# Patient Record
Sex: Female | Born: 1985 | Race: White | Hispanic: No | Marital: Single | Smoking: Former smoker
Health system: Southern US, Community
[De-identification: ages and names within clinical notes are randomized; demographics above are authoritative.]

## PROBLEM LIST (undated history)

## (undated) ENCOUNTER — Inpatient Hospital Stay (HOSPITAL_COMMUNITY): Payer: Self-pay

## (undated) DIAGNOSIS — F329 Major depressive disorder, single episode, unspecified: Secondary | ICD-10-CM

## (undated) DIAGNOSIS — R519 Headache, unspecified: Secondary | ICD-10-CM

## (undated) DIAGNOSIS — F32A Depression, unspecified: Secondary | ICD-10-CM

## (undated) DIAGNOSIS — T783XXA Angioneurotic edema, initial encounter: Secondary | ICD-10-CM

## (undated) DIAGNOSIS — L509 Urticaria, unspecified: Secondary | ICD-10-CM

## (undated) DIAGNOSIS — F909 Attention-deficit hyperactivity disorder, unspecified type: Secondary | ICD-10-CM

## (undated) DIAGNOSIS — Z8619 Personal history of other infectious and parasitic diseases: Secondary | ICD-10-CM

## (undated) DIAGNOSIS — R51 Headache: Secondary | ICD-10-CM

## (undated) DIAGNOSIS — F419 Anxiety disorder, unspecified: Secondary | ICD-10-CM

## (undated) DIAGNOSIS — J45909 Unspecified asthma, uncomplicated: Secondary | ICD-10-CM

## (undated) HISTORY — DX: Angioneurotic edema, initial encounter: T78.3XXA

## (undated) HISTORY — PX: NO PAST SURGERIES: SHX2092

## (undated) HISTORY — DX: Personal history of other infectious and parasitic diseases: Z86.19

## (undated) HISTORY — PX: TYMPANOSTOMY TUBE PLACEMENT: SHX32

## (undated) HISTORY — DX: Urticaria, unspecified: L50.9

## (undated) HISTORY — DX: Unspecified asthma, uncomplicated: J45.909

---
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
## 2006-01-06 ENCOUNTER — Ambulatory Visit (HOSPITAL_COMMUNITY): Payer: Self-pay | Admitting: Psychiatry

## 2006-01-20 ENCOUNTER — Ambulatory Visit (HOSPITAL_COMMUNITY): Payer: Self-pay | Admitting: Psychiatry

## 2006-02-10 ENCOUNTER — Ambulatory Visit (HOSPITAL_COMMUNITY): Payer: Self-pay | Admitting: Psychiatry

## 2006-04-28 ENCOUNTER — Ambulatory Visit (HOSPITAL_COMMUNITY): Payer: Self-pay | Admitting: Psychiatry

## 2009-01-04 ENCOUNTER — Ambulatory Visit: Payer: Self-pay | Admitting: Diagnostic Radiology

## 2009-01-04 ENCOUNTER — Emergency Department (HOSPITAL_BASED_OUTPATIENT_CLINIC_OR_DEPARTMENT_OTHER): Admission: EM | Admit: 2009-01-04 | Discharge: 2009-01-04 | Payer: Self-pay | Admitting: Emergency Medicine

## 2009-01-05 ENCOUNTER — Emergency Department (HOSPITAL_BASED_OUTPATIENT_CLINIC_OR_DEPARTMENT_OTHER): Admission: EM | Admit: 2009-01-05 | Discharge: 2009-01-05 | Payer: Self-pay | Admitting: Emergency Medicine

## 2010-07-25 LAB — COMPREHENSIVE METABOLIC PANEL
ALT: 16 U/L (ref 0–35)
AST: 24 U/L (ref 0–37)
Albumin: 4.6 g/dL (ref 3.5–5.2)
Alkaline Phosphatase: 80 U/L (ref 39–117)
CO2: 29 mEq/L (ref 19–32)
Creatinine, Ser: 0.8 mg/dL (ref 0.4–1.2)
GFR calc non Af Amer: >60 mL/min (ref >60)
Glucose, Bld: 86 mg/dL (ref 70–99)
Potassium: 3.9 mEq/L (ref 3.5–5.1)
Sodium: 142 mEq/L (ref 135–145)
Total Bilirubin: 0.4 mg/dL (ref 0.3–1.2)

## 2010-07-25 LAB — URINALYSIS, ROUTINE W REFLEX MICROSCOPIC
Bilirubin Urine: NEGATIVE
Glucose, UA: NEGATIVE mg/dL
Ketones, ur: NEGATIVE mg/dL
Specific Gravity, Urine: 1.003 — ABNORMAL LOW (ref 1.005–1.030)
pH: 7 (ref 5.0–8.0)

## 2010-07-25 LAB — WET PREP, GENITAL: Trich, Wet Prep: NONE SEEN

## 2010-07-25 LAB — URINE MICROSCOPIC-ADD ON

## 2010-07-25 LAB — GC/CHLAMYDIA PROBE AMP, GENITAL
Chlamydia, DNA Probe: NEGATIVE
GC Probe Amp, Genital: NEGATIVE

## 2010-07-25 LAB — CBC
HCT: 45.2 % (ref 36.0–46.0)
Hemoglobin: 15.7 g/dL — ABNORMAL HIGH (ref 12.0–15.0)
MCV: 95.9 fL (ref 78.0–100.0)
Platelets: 271 10*3/uL (ref 150–400)
RBC: 4.71 MIL/uL (ref 3.87–5.11)

## 2010-07-25 LAB — POCT CARDIAC MARKERS: CKMB, poc: <1 ng/mL — ABNORMAL LOW (ref 1.0–8.0)

## 2010-07-25 LAB — DIFFERENTIAL
Basophils Absolute: 0.1 10*3/uL (ref 0.0–0.1)
Basophils Relative: 1 % (ref 0–1)
Lymphocytes Relative: 23 % (ref 12–46)
Lymphs Abs: 3.8 10*3/uL (ref 0.7–4.0)
Monocytes Absolute: 1.1 10*3/uL — ABNORMAL HIGH (ref 0.1–1.0)
Monocytes Relative: 7 % (ref 3–12)

## 2010-09-05 NOTE — Discharge Summary (Signed)
[facility]  Patient:    [name], [name]                          MRN: [id number] Adm. Date:  [id number] Disc. Date: 08/27/00 Attending:  Veneta Penton                           Discharge Summary  REASON FOR ADMISSION:  This 25 year old white female was admitted complaining of depression with suicidal ideation with a plan to kill herself by obtaining a gun from a friend and shooting herself.  For further history of present illness, please see the patients psychiatric admission assessment.  PHYSICAL EXAMINATION:  At the time of admission, significant only for old healed self-inflicted lacerations on the left forearm.  LABORATORY EXAMINATION:  The patient underwent a laboratory workup to rule out any medical problems contributing to her symptomatology.  A urine drug screen was negative.  Urine probe for gonorrhea and chlamydia are pending at the time of discharge.  Urine pregnancy test was negative.  Thyroid function tests were within normal limits.  A UA was unremarkable.  CBC showed MCHC of 35.0 and was otherwise unremarkable.  A potassium level, on admission, was 3.4 and a routine chem panel was otherwise unremarkable.  Hepatic panel was within normal limits.  The patient received no x-rays, no special procedures, no additional consultation.  She sustained no complications during the course of her hospitalization.  RPR was nonreactive.  HOSPITAL COURSE:  The patient rapidly adapted to unit routine socializing well with both patients and staff.  She showed an excessive reliance of borderline and histrionic defense mechanisms.  She was continued on Concerta 54 mg.  Her Effexor XR was titrated up to a therapeutic range.  She, in relying on borderline defense mechanisms, showed occasional disorganization of thoughts whenever she became angry or agitated and had difficulty sleeping at night with this.  She was begun on a trial of Seroquel at 25 mg p.o.  q.h.s. and tolerated this well and reports this has greatly aided in decreasing her agitation and enabling her to sleep.  She is continuing to do well on Effexor XR.  She denies any suicidal or homicidal ideation and she denies any side effects from her medications.  She is participating in all aspects of the therapeutic treatment program, no longer appears to be a danger to herself or others, is able to perform all her activities of daily living and is consequently felt to have reached her maximum benefits of hospitalization and is ready for discharge to a less restrictive alternative setting.  CONDITION ON DISCHARGE:  Improved.  DIAGNOSES:  (According to DSM-IV). Axis I:    1. Major depression, recurrent-type, severe without psychosis.            2. Oppositional defiant disorder.            3. Attention-deficit hyperactivity disorder, combined-type.            4. Rule out conduct disorder. Axis II:   1. Histrionic and borderline traits.            2. Rule out personality disorder not otherwise specified. Axis III:  None. Axis IV:   Severe. Axis V:    10-20 on admission; 30 on discharge.  FURTHER EVALUATION AND TREATMENT RECOMMENDATIONS: 1. The patient is discharged to home. 2. She is discharged on an unrestricted level of activity and  a regular diet. 3. She is discharged on Effexor XR 225 mg p.o. q.a.m. with food, Seroquel    25 mg p.o. q.h.s. p.r.n. for agitation or insomnia, Concerta 54 mg p.o.    q.a.m. 4. She will follow up with her outpatient psychiatrist for all further aspects    of her psychiatric care and consequently I will sign off on the case at    this time. DD:  08/27/00 TD:  08/27/00 Job: [id number]

## 2010-09-05 NOTE — H&P (Signed)
NAMECHANTELL, [name]                             ACCOUNT NO.:  [id number]   MEDICAL RECORD NO.:  [id number]                   PATIENT TYPE:  INP   LOCATION:  0104                                 [facility]ILITY[facility]   PHYSICIAN:  Nawar M. Alnaquib, M.D.             DATE OF BIRTH:  [birth year]   DATE OF ADMISSION:  10/23/2002  DATE OF DISCHARGE:  10/27/2002                         PSYCHIATRIC ADMISSION ASSESSMENT   HISTORY OF PRESENT ILLNESS:  This 25 year old black female was admitted  because she was reported to be suicidal by the mother and grandmother, and  she denies that she was suicidal before.  There was a fight with mom who  told her that she did not want her anymore in house.  The patient started  crying and she reports that she and her mom were not getting along well for  the last 2 years.  Mom acts as if she hates me.  She yells at me.  The  patient is __________ , but this has been happening to __________  lately.   JUSTIFICATION FOR 24-HOUR CARE:  Dangerous to self.   PAST PSYCHIATRIC HISTORY:  Fourth psychiatric admission to [facility].   MENTAL STATUS EXAM:  On admission, the patient states she is upset about  everything going on in her life, however she was not suicidal nor homicidal.  No auditory or visual hallucinations, no delusions.  Anxious appearing,  about her placement, where she is going to go and __________ .   ADMISSION DIAGNOSES:   AXIS I:  Major depressive disorder with suicidal ideation.   AXIS II:  Deferred.   AXIS III:  None.   AXIS IV:  Parent/child relation problems.   AXIS V:  Global assessment of function:  35.  __________   ESTIMATED LENGTH OF INPATIENT STAY:  One week.   HOSPITAL COURSE:  The patient participated well in group and individual  therapy and family meetings, and was compliant with medications.  The  following were the lab results.  CBC:  White blood count was 13.1, RBC count  30.99, hemoglobin 13.7,  hematocrit 37.4.  MCV 93.7.  MCHC 35.3, platelets  268, neutrophils 66, lymphocytes 25.  There was an increased percentage of  neutrophils.  However, the rest of the differential count was normal.  Routine chemistries:  Sodium 139, potassium 3.9, chloride 107, CO2 57,  glucose 84, BUN 7, creatinine 0.7, calcium 9.4.  Lipid panel was also within  normal limits.  __________ profile was normal.  Thyroid function test was  normal.  __________ 64 .  Urine pregnancy test was negative.  Urine drug  screen __________ was negative.  Urinalysis was clear.  GC probe was  negative.  Chlamydia probe was negative.  Those were all the tests that were  done.  Negative RPR.   At the time of discharge, the __________ .    DISCHARGE DIAGNOSES:  AXIS I:  Major depressive disorder with suicidal ideation.   AXIS II:  Deferred.   AXIS III:  None.   AXIS IV:  Status post __________ , parental/child relation problems.   AXIS V:  Global assessment of function 50.   DISPOSITION:  She was discharged to home.  __________ .  __________ 40 mg  q.a.m.  Followup __________ with Dr. Ralene Muskrat at __________  [facility], and will also be receiving individual therapy.                                               Nawar M. Alnaquib, M.D.    NMA/MEDQ  D:  11/18/2002  T:  11/20/2002  Job:  [id number]

## 2010-09-05 NOTE — Discharge Summary (Signed)
[name], [name]                             ACCOUNT NO.:  [id number]   MEDICAL RECORD NO.:  [id number]                   PATIENT TYPE:  INP   LOCATION:  0199                                 [facility]ACILITY:  [facility]   PHYSICIAN:  Cindie Crumbly, M.D.               DATE OF BIRTH:  Feb 02, 1986   DATE OF ADMISSION:  06/12/2002  DATE OF DISCHARGE:                                 DISCHARGE SUMMARY   REASON FOR ADMISSION:  This 25 year old white female was admitted  complaining of depression with suicidal ideation with a plan to kill  herself. For further history of present illness please see the patient's  psychiatric admission assessment.   PHYSICAL EXAMINATION:  At the time of admission was entirely unremarkable.   LABORATORY DATA:  The patient underwent a laboratory workup to rule out any  medical problems contributing to her symptomology. A urine probe for  gonorrhea and Chlamydia were negative. RPR was nonreactive. TSH was 0.612,  free T4 was 0.88. GGT was within normal limits. Hepatic panel was within  normal limits. Basic metabolic panel was within normal limits. A CBC showed  an MCV of 93.5, MCHC of 34.5 and was otherwise unremarkable. Urine drug  screen was negative. Urine pregnancy test was negative. Urinalysis was  unremarkable.   The patient received no x-rays, no special procedures, no additional  consultations. She sustained no complications during the course of this  hospitalization.   HOSPITAL COURSE:  On admission the patient was  psychomotor agitated with  decreased concentration, poor impulse control, decreased attention span. She  was easily distracted by extraneous stimuli. She was oppositional and  defiant, gamy and manipulative. Her affect and mood were depressed,  irritable, angry and hostile. She was continued on Lexapro and Strattera and  tolerated these medications well without side effects.   At the time of discharge she denied any suicidal or homicidal  ideation. Her  affect and mood have improved. Her  concentration has increased. She no  longer appears to be a danger to herself or others. She has displayed no  evidence of a thought disorder throughout her hospital course. Consequently  it is felt she has reached her maximum benefits of hospitalization and is  ready for discharge to a less restrictive alternative setting.   CONDITION ON DISCHARGE:  Improved.   DIAGNOSES:  According to DSM4:   AXIS I:  1. Major depression, recurrent, severe without psychosis.  2. Attention deficit hyperactivity disorder, combined type.  3. Oppositional defiant disorder.  4. Rule out conduct disorder.   AXIS II:  1. Borderline and histrionic traits.  2. Rule out personality disorder not otherwise specified.  3. Rule out learning disorder not otherwise specified.   AXIS III:  None.   AXIS IV:  Severe.   AXIS V:  Code 20 on admission, code 30 on discharge.   FURTHER EVALUATION AND TREATMENT RECOMMENDATIONS:  The  patient is discharged  to home. She is discharged on an unrestricted level of activity and a  regular diet.   FOLLOW UP:  She will follow up with her outpatient psychiatrist, Dr. Kizzie Bane,  for all further aspects of her psychiatric care and consequently I will sign  off on the case.                                               Cindie Crumbly, M.D.    TS/MEDQ  D:  06/16/2002  T:  06/16/2002  Job:  [id number]

## 2010-09-05 NOTE — H&P (Signed)
[facility]  Patient:    [name], [name]                          MRN: [id number] Adm. Date:  [id number] Attending:  Milford Cage H                   Psychiatric Admission Assessment  REASON FOR ADMISSION:  This 25 year old white female was admitted complaining of depression with suicidal ideation with a plan to shoot herself with a gun. She had no access to a gun, but also stated that she would cut her wrists and bleed to death.  She was unable to contract for safety.  She also had been making superficial cuts on her left forearm as a preliminary of harming herself.  HISTORY OF PRESENT ILLNESS:  She admits to recurrent episodes of major depression and admits to a depressed, irritable, and angry mood most of the day nearly every day over the past several months, along with anhedonia, decreased school performance, decreased hygiene, giving up on activities previous enjoyed, insomnia, decreased appetite.  She has been increasingly isolative and withdrawn.  She admits to feelings of hopelessness, helplessness, worthlessness, decreased concentration and energy level, increased symptoms of fatigue, psychomotor agitation.  She refuses to contract for safety.  PAST HISTORY:  Significant for her being treated in outpatient therapy by Dr. Milford Cage.  She has a past history of attention deficit hyperactivity disorder, combined type, oppositional defiant disorder, and recurrent episodes of major depression.  She denies any history of drug or alcohol abuse.  She denies any medical or surgical problems.  ALLERGIES:  She is allergic to [facility], but otherwise has no drug allergies or sensitivities.  MEDICATIONS:  She currently is taking Effexor XR 75 mg p.o. q.a.m. for the past 1-2 months, which she reports has not been helpful to her.  She also reports taking Concerta 18 mg p.o. b.i.d. and also is continuing to have poor concentration in  school.  FAMILY/SOCIAL HISTORY:  The patients father died eight years ago in a motor vehicle accident that was alcohol related.  He had a history of alcoholism. The patient lives with her mother and 61 year old sister.  Her mother is very supportive of her, but the patient reports getting into a great deal of oppositional and defiant behavior with her mother.  MENTAL STATUS EXAMINATION:  The patient presents as a well-developed, well-nourished adolescent white female who is alert, oriented x 4, oppositional defiant, disheveled, unkept, with poor hygiene, easily distracted by extraneous stimuli.  She is psychomotor agitated with decreased concentration and attention span, poor impulse control.  Her affect and mood are depressed and irritable.  Speech is coherent to a decreased rate and volume of speech, increased speech latency.  She displays no looseness of associations or evidence of a thought disorder.  Her thought processes are generally goal directed.  DIAGNOSES: Axis I:    1. Major depression, recurrent, severe, without psychosis.            2. Oppositional defiant disorder.            3. Rule out conduct disorder.            4. Attention deficit hyperactivity disorder, combined type. Axis II:   1. Histrionic traits.            2. Rule out personality disorder, not otherwise specified. Axis III:  None. Axis IV:   Severe. Axis V:  Code 20.  FURTHER EVALUATION AND TREATMENT RECOMMENDATIONS: 1. The estimated length of stay for the patient on the inpatient unit is 5-7    days. 2. Initial discharge plan is to discharge the patient to home.  INITIAL PLAN OF CARE:  Increase the patients Effexor XR to 150 mg p.o. q.a.m. and consider increasing Concerta in an attempt to improve concentration and attention span.  Psychotherapy will focus on decreasing potential for harm to self and others, increasing impulse control, and decreasing cognitive distortions.  A laboratory workup will  also be initiated to rule out any medical problems contributing to her symptomatology. DD:  07/02/00 TD:  07/02/00 Job: [id number]

## 2010-09-05 NOTE — Discharge Summary (Signed)
[name], [name]                             ACCOUNT NO.:  [id number]   MEDICAL RECORD NO.:  [id number]                   PATIENT TYPE:  INP   LOCATION:  0104                                 [facility]ILITY[facility]   PHYSICIAN:  Nawar M. Alnaquib, M.D.             DATE OF BIRTH:  03-19-1986   DATE OF ADMISSION:  10/23/2002  DATE OF DISCHARGE:  10/27/2002                                 DISCHARGE SUMMARY   HISTORY OF PRESENT ILLNESS:  This is a 25 year old African-American female  admitted because he was reported to be suicidal by her mother and her  grandmother and there was a fight between the mother and the patient and the  mother had told her that she did not want her in the house anymore.   JUSTIFICATION FOR 24-HOUR CARE:  Dangerous to self.   PAST PSYCHIATRIC HISTORY:  Fourth psychiatric admission to [facility].   MENTAL STATUS EXAM:  On admission, very upset about everything going on in  her life, she stated.  She was, however, not suicidal or homicidal.  She  denied completely any suicidality.  She denied auditory or visual  hallucinations.  She denied delusions.  Appeared very anxious about her  placement and where she is going to go.   ADMISSION DIAGNOSES:   AXIS I:  Major depressive disorder with suicidal ideation.   AXIS II:  Deferred.   AXIS III:  None.   AXIS IV:  Parent-child relation problems.   AXIS V:  Global Assessment of Functioning 35.   HOSPITAL COURSE:  The patient participated well in group, individual therapy  and family meetings and was compliant with medications.  The hospital course  was uneventful.   LABORATORY DATA:  CBC with white blood cell count 13.1, RBC count 3.9,  hemoglobin 13.7, hematocrit 37.4, MCV 93.7, MCHC 35.3, platelets 268,000.  Neutrophils 66%, lymphocytes 25%, increased percentage of neutrophils was  reported.  Routine chemistries were normal.  Serum sodium was 139, potassium  3.9, chloride 107, CO2 57, glucose  84, BUN 7, creatinine 0.75, calcium 9.4.  Lipid profile was also within normal limits.  Thyroid function tests were  normal.  Urine pregnancy test was negative.  Urine drug screen was negative.  Urinalysis was clear.  Probe for GC was negative.  Chlamydia probe was also  negative.  RPR was nonreactive.   DISCHARGE DIAGNOSES:   AXIS I:  Major depressive disorder with suicidal ideation, in remission.   AXIS II:  Deferred.   AXIS III:  None.   AXIS IV:  Parent-child relational problems.   AXIS V:  Global Assessment of Functioning 50.   DISPOSITION:  She was discharged home.   FOLLOW UP:  Dr. Ralene Muskrat at [facility].  She will also be  receiving individual therapy at that clinic.  Nawar M. Alnaquib, M.D.    NMA/MEDQ  D:  12/09/2002  T:  12/10/2002  Job:  [id number]

## 2010-09-05 NOTE — H&P (Signed)
[facility]  Patient:    [name], [name]                          MRN: [id number] Adm. Date:  [id number] Attending:  Veneta Penton                   Psychiatric Admission Assessment  DATE OF ADMISSION:  Aug 23, 2000  PATIENT IDENTIFICATION:  This 25 year old white female was admitted complaining of depression with suicidal ideation with a plan to kill herself by obtaining a gun from a friend and shooting herself.  HISTORY OF PRESENT ILLNESS:  The patient apparently had been emailing friends trying to obtain a gun so that she could use it to suicide.  She admits to an increasingly depressed, irritable, and angry mood most of the day nearly every day, anhedonia, decreased school performance, giving up on activities previously enjoyed, decreased concentration and energy level, insomnia, decreased appetite, feelings of hopelessness, helplessness, worthlessness, increased symptoms of fatigue, recurrent thoughts of death, psychomotor agitation.  PAST PSYCHIATRIC HISTORY:  She was last hospitalized at [facility] in March 2002 for a period of five days.  At that time, she was discharged on Effexor XR 150 mg p.o. q.d. and Concerta 54 mg p.o. q.a.m.  She did well on this medication while hospitalized but apparently was noncompliant with taking after leaving the hospital.  She has been seen in outpatient psychiatric treatment by Dr. Milford Cage.  She has a history of attention-deficit/hyperactivity disorder, oppositional defiant disorder, and recurrent episodes of major depression as well as a longstanding history of histrionic traits and is heavily reliant on histrionic and borderline defense mechanisms.  SUBSTANCE ABUSE HISTORY:  The patient reports trying alcohol and cannabis on one occasion in the distant past.  She denies any recent use.  PAST MEDICAL HISTORY:  She has no history of medical or surgical problems. She is  allergic to [facility].  SOCIAL HISTORY:  The patient lives with her mother and 24 year old sister. She is in the seventh grade.  Father died approximately eight years ago in a motor vehicle accident while he was intoxicated.  He had a history of alcohol problems.  MENTAL STATUS EXAMINATION:  The patient presents as a well-developed, well-nourished, adolescent female who is alert and oriented x 4, oppositional and defiant, psychomotor agitated with a furrowed brow, and whose appearance is compatible with her stated age.  Speech is coherent with a decreased rate and volume of speech, increased speech latency.  She displays no looseness of associations or evidence of a thought disorder.  Affect and mood are depressed, irritable, and angry.  Concentration and attention span are decreased.  She is easily distracted, displays poor impulse control. Immediate recall, short-term memory, and remote memory are intact.  Thought processes are goal directed.  Similarities and differences are within normal limits and she is able to abstract simple proverbs.  ADMISSION DIAGNOSES: Axis I:    1. Major depression, recurrent, severe without psychosis.            2. Oppositional defiant disorder.            3. Rule out conduct disorder.            4. Attention-deficit/hyperactivity disorder, combined type. Axis II:   1. Histrionic traits.            2. Rule out personality disorder, not otherwise specified. Axis III:  None.  Axis IV:   Severe. Axis V:    10 to 20.  INITIAL PLAN OF CARE:  Restart Concerta and Effexor.  Psychotherapy will focus on issues of loss, issues of medication compliance, decreasing cognitive distortions, improving impulse control, and decreasing potential for self-harm.  A laboratory workup will also be initiated to rule out any medical problems contributing to her symptomatology.  ESTIMATED LENGTH OF STAY:  Five to seven days.  POST HOSPITAL CARE PLAN:  Discharge the patient to  home.DD:  08/23/00 TD:  08/23/00 Job: [id number]

## 2010-09-05 NOTE — Discharge Summary (Signed)
[facility]  Patient:    [name], [name]                          MRN: [id number] Adm. Date:  [id number] Disc. Date: 07/06/00 Attending:  Milford Cage H                           Discharge Summary  REASON FOR ADMISSION:  This 25 year old white female was admitted complaining of depression and suicidal ideation with a plan to shoot herself with a gun. For further history of present illness, please see the patients psychiatric admission assessment.  PHYSICAL EXAMINATION AT THE TIME OF ADMISSION:  Entirely unremarkable.  LABORATORY EXAMINATION:  The patient underwent a laboratory workup to rule out any medical problems contributing to her symptomatology.  Hepatic panel was within normal limits.  UA was within normal limits.  Basic metabolic panel was within normal limits.  CBC showed MCHC 34.9; otherwise unremarkable.  Thyroid function tests were within normal limits.  GGT was within normal limits.  The patient received no x-rays, no special procedures, no additional consultations.  She sustained no complications during the course of this hospitalization.  HOSPITAL COURSE:  The patient rapidly adapted to unit routine, socializing well with both patients and staff.  On admission, she was oppositional, defiant, disheveled, unkempt with poor hygiene, psychomotor agitated with decreased concentration and attention span.  She was easily distracted, hyperactive with poor impulse control.  Affect and mood were depressed and irritable and she continued to admit to suicidal ideation and wanting to harm herself.  She was continued on a trial of Concerta and Effexor XR and titrated up to a therapeutic dose.  At the time of discharge, her affect and mood have improved.  She denies any homicidal or suicidal ideation.  She is participating in all aspects of therapeutic treatment program.  She is showing much less oppositional and defiant behavior and is cooperative  within the milieu.  She has addressed a behavioral treatment plan for herself both at home and at school and consequently it is felt the patient has reached her maximum benefits of hospitalization and is ready for discharge to a less restrictive alternative setting.  CONDITION ON DISCHARGE:  Improved.  DIAGNOSES: Axis I:    1. Major depression, recurrent type, severe without psychosis.            2. Oppositional defiant disorder.            3. Rule out conduct disorder.            4. Attention-deficit/hyperactivity disorder, combined type. Axis II:   1. Histrionic traits.            2. Rule out personality disorder, not otherwise specified. Axis III:  None. Axis IV:   Severe. Axis V:    20 on admission, 30 on discharge.  FURTHER EVALUATION AND TREATMENT RECOMMENDATIONS: 1. The patient is discharged to home. 2. The patient is discharged on a unrestricted level of activity and a regular    diet. 3. She will follow up with her outpatient psychiatrist and community mental    health center for all further aspects of her mental health care and    consequently, I will sign off on the case at this time.  DISCHARGE MEDICATIONS: 1. Concerta 54 mg p.o. q.a.m. 2. Effexor XR 150 mg p.o. q.a.m. with food. DD:  07/06/00 TD:  07/06/00 Job:  [id number]
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
## 2011-09-03 ENCOUNTER — Other Ambulatory Visit: Payer: Self-pay | Admitting: Family Medicine

## 2011-09-03 ENCOUNTER — Ambulatory Visit
Admission: RE | Admit: 2011-09-03 | Discharge: 2011-09-03 | Disposition: A | Discharge status: Home / Self Care | Payer: No Typology Code available for payment source | Source: Ambulatory Visit | Attending: Family Medicine | Admitting: Family Medicine

## 2011-09-03 DIAGNOSIS — R1011 Right upper quadrant pain: Secondary | ICD-10-CM

## 2011-09-03 MED ORDER — IOHEXOL 300 MG/ML  SOLN
40.0000 mL | Freq: Once | INTRAMUSCULAR | Status: AC | PRN
  Administered 2011-09-03: 40 mL via ORAL

## 2011-09-03 MED ORDER — IOHEXOL 300 MG/ML  SOLN
40.0000 mL | Freq: Once | INTRAMUSCULAR | Status: AC | PRN
  Administered 2011-09-03: 100 mL via INTRAVENOUS

## 2011-10-21 ENCOUNTER — Other Ambulatory Visit: Payer: Self-pay | Admitting: Gastroenterology

## 2011-10-21 DIAGNOSIS — R1013 Epigastric pain: Secondary | ICD-10-CM

## 2011-10-26 ENCOUNTER — Ambulatory Visit
Admission: RE | Admit: 2011-10-26 | Discharge: 2011-10-26 | Disposition: A | Discharge status: Home / Self Care | Payer: No Typology Code available for payment source | Source: Ambulatory Visit | Attending: Gastroenterology | Admitting: Gastroenterology

## 2011-10-26 DIAGNOSIS — R1013 Epigastric pain: Secondary | ICD-10-CM

## 2012-12-13 DIAGNOSIS — H9319 Tinnitus, unspecified ear: Secondary | ICD-10-CM | POA: Insufficient documentation

## 2012-12-13 DIAGNOSIS — H9209 Otalgia, unspecified ear: Secondary | ICD-10-CM | POA: Insufficient documentation

## 2012-12-13 DIAGNOSIS — H903 Sensorineural hearing loss, bilateral: Secondary | ICD-10-CM | POA: Insufficient documentation

## 2012-12-13 DIAGNOSIS — M26629 Arthralgia of temporomandibular joint, unspecified side: Secondary | ICD-10-CM | POA: Insufficient documentation

## 2014-03-08 ENCOUNTER — Encounter (HOSPITAL_BASED_OUTPATIENT_CLINIC_OR_DEPARTMENT_OTHER): Payer: Self-pay | Admitting: *Deleted

## 2014-03-08 ENCOUNTER — Emergency Department (HOSPITAL_BASED_OUTPATIENT_CLINIC_OR_DEPARTMENT_OTHER): Payer: No Typology Code available for payment source

## 2014-03-08 ENCOUNTER — Emergency Department (HOSPITAL_BASED_OUTPATIENT_CLINIC_OR_DEPARTMENT_OTHER): Payer: Self-pay

## 2014-03-08 ENCOUNTER — Emergency Department (HOSPITAL_BASED_OUTPATIENT_CLINIC_OR_DEPARTMENT_OTHER)
Admission: EM | Admit: 2014-03-08 | Discharge: 2014-03-08 | Disposition: A | Discharge status: Home / Self Care | Payer: No Typology Code available for payment source | Attending: Emergency Medicine | Admitting: Emergency Medicine

## 2014-03-08 ENCOUNTER — Other Ambulatory Visit: Payer: Self-pay

## 2014-03-08 DIAGNOSIS — R05 Cough: Secondary | ICD-10-CM

## 2014-03-08 DIAGNOSIS — Z79899 Other long term (current) drug therapy: Secondary | ICD-10-CM | POA: Insufficient documentation

## 2014-03-08 DIAGNOSIS — R000 Tachycardia, unspecified: Secondary | ICD-10-CM | POA: Insufficient documentation

## 2014-03-08 DIAGNOSIS — R55 Syncope and collapse: Secondary | ICD-10-CM | POA: Insufficient documentation

## 2014-03-08 DIAGNOSIS — Z3202 Encounter for pregnancy test, result negative: Secondary | ICD-10-CM | POA: Insufficient documentation

## 2014-03-08 DIAGNOSIS — F909 Attention-deficit hyperactivity disorder, unspecified type: Secondary | ICD-10-CM | POA: Insufficient documentation

## 2014-03-08 DIAGNOSIS — J40 Bronchitis, not specified as acute or chronic: Secondary | ICD-10-CM

## 2014-03-08 DIAGNOSIS — J209 Acute bronchitis, unspecified: Secondary | ICD-10-CM | POA: Insufficient documentation

## 2014-03-08 DIAGNOSIS — R059 Cough, unspecified: Secondary | ICD-10-CM

## 2014-03-08 HISTORY — DX: Attention-deficit hyperactivity disorder, unspecified type: F90.9

## 2014-03-08 LAB — CBC
HCT: 39.5 % (ref 36.0–46.0)
HEMOGLOBIN: 13.9 g/dL (ref 12.0–15.0)
MCH: 33.8 pg (ref 26.0–34.0)
MCHC: 35.2 g/dL (ref 30.0–36.0)
MCV: 96.1 fL (ref 78.0–100.0)
Platelets: 183 10*3/uL (ref 150–400)
RBC: 4.11 MIL/uL (ref 3.87–5.11)
RDW: 11.8 % (ref 11.5–15.5)
WBC: 19.2 10*3/uL — ABNORMAL HIGH (ref 4.0–10.5)

## 2014-03-08 LAB — BASIC METABOLIC PANEL
Anion gap: 15 (ref 5–15)
BUN: 12 mg/dL (ref 6–23)
CALCIUM: 9 mg/dL (ref 8.4–10.5)
CO2: 21 meq/L (ref 19–32)
CREATININE: 0.7 mg/dL (ref 0.50–1.10)
Chloride: 104 mEq/L (ref 96–112)
GFR calc Af Amer: >90 mL/min (ref >90)
GFR calc non Af Amer: >90 mL/min (ref >90)
GLUCOSE: 97 mg/dL (ref 70–99)
Potassium: 4.4 mEq/L (ref 3.7–5.3)
Sodium: 140 mEq/L (ref 137–147)

## 2014-03-08 LAB — URINE MICROSCOPIC-ADD ON

## 2014-03-08 LAB — URINALYSIS, ROUTINE W REFLEX MICROSCOPIC
BILIRUBIN URINE: NEGATIVE
GLUCOSE, UA: NEGATIVE mg/dL
KETONES UR: NEGATIVE mg/dL
LEUKOCYTES UA: NEGATIVE
Nitrite: NEGATIVE
PH: 5 (ref 5.0–8.0)
Protein, ur: NEGATIVE mg/dL
Specific Gravity, Urine: 1.013 (ref 1.005–1.030)
Urobilinogen, UA: 0.2 mg/dL (ref 0.0–1.0)

## 2014-03-08 LAB — PREGNANCY, URINE: Preg Test, Ur: NEGATIVE

## 2014-03-08 MED ORDER — AZITHROMYCIN 250 MG PO TABS: 250.0000 mg | ORAL_TABLET | Freq: Every day | ORAL | Status: DC

## 2014-03-08 NOTE — ED Provider Notes (Signed)
CSN: [id number]     Arrival date & time 03/08/14  1330 History   First MD Initiated Contact with Patient 03/08/14 1346     Chief Complaint  Patient presents with  . Near Syncope     (Consider location/radiation/quality/duration/timing/severity/associated sxs/prior Treatment) HPI Comments: This is a 28 y/o female with a PMHx of asthma and ADHD who presents to the ED via EMS after having a syncopal episode while at drill today at the [employer]. Patient reports there were doing circuit drills, and when she went on the treadmill, she started to feel dizzy. She then set up from a seating position, felt dizzy, went into the locker room and "blacked out". She did not hit her head. EMS was then called, and on their arrival she was orthostatic with an increased heart rate on sitting up. Patient was dizzy when EMS arrived. She was given 200 mL bolus. Patient states that yesterday she started to feel "sick" with a productive cough, congestion and chills. No fevers. She had a normal appetite yesterday despite triage summary. This morning she had a few bites of an Egg McMuffin. Denies nausea or vomiting. Denies history of syncopal episodes. No family history of early heart disease or sudden cardiac death. Currently she is feeling fine and is requesting to go back to drill.  The history is provided by the patient and the EMS personnel.    Past Medical History  Diagnosis Date  . ADHD (attention deficit hyperactivity disorder)    History reviewed. No pertinent past surgical history. No family history on file. History  Substance Use Topics  . Smoking status: Not on file  . Smokeless tobacco: Not on file  . Alcohol Use: No   OB History    No data available     Review of Systems  10 Systems reviewed and are negative for acute change except as noted in the HPI.  Allergies  Review of patient's allergies indicates no known allergies.  Home Medications   Prior to Admission medications    Medication Sig Start Date End Date Taking? Authorizing Provider  amphetamine-dextroamphetamine (ADDERALL XR) 5 MG 24 hr capsule Take 5 mg by mouth daily.   Yes Historical Provider, MD   BP 120/85 mmHg  Pulse 99  Temp(Src) 98.3 F (36.8 C) (Oral)  Resp 18  Ht 5\' 7"  (1.702 m)  Wt 134 lb (60.782 kg)  BMI 20.98 kg/m2  SpO2 100%  LMP 03/08/2014 Physical Exam  Constitutional: She is oriented to person, place, and time. She appears well-developed and well-nourished. No distress.  HENT:  Head: Normocephalic and atraumatic.  Mouth/Throat: Oropharynx is clear and moist.  Eyes: Conjunctivae and EOM are normal. Pupils are equal, round, and reactive to light.  Neck: Normal range of motion. Neck supple. No JVD present.  Cardiovascular: Regular rhythm, normal heart sounds and intact distal pulses.   Tachy ~100. No extremity edema.  Pulmonary/Chest: Effort normal and breath sounds normal. No respiratory distress.  Abdominal: Soft. Bowel sounds are normal. There is no tenderness.  Musculoskeletal: Normal range of motion. She exhibits no edema.  Neurological: She is alert and oriented to person, place, and time. She has normal strength. No cranial nerve deficit or sensory deficit. Coordination and gait normal.  Speech fluent, goal oriented. Moves limbs without ataxia. Equal grip strength bilateral.  Skin: Skin is warm and dry. No rash noted. She is not diaphoretic.  Psychiatric: She has a normal mood and affect. Her behavior is normal.  Nursing note and vitals  reviewed.   ED Course  Procedures (including critical care time) Labs Review Labs Reviewed  CBC - Abnormal; Notable for the following:    WBC 19.2 (*)    All other components within normal limits  URINALYSIS, ROUTINE W REFLEX MICROSCOPIC - Abnormal; Notable for the following:    Hgb urine dipstick TRACE (*)    All other components within normal limits  URINE MICROSCOPIC-ADD ON - Abnormal; Notable for the following:    Bacteria, UA  FEW (*)    All other components within normal limits  BASIC METABOLIC PANEL  PREGNANCY, URINE  D-DIMER, QUANTITATIVE    Imaging Review No results found.   EKG Interpretation   Date/Time:  Thursday March 08 2014 13:42:36 EST Ventricular Rate:  101 PR Interval:  158 QRS Duration: 84 QT Interval:  344 QTC Calculation: 446 R Axis:   94 Text Interpretation:  Sinus tachycardia Rightward axis Borderline ECG No  old tracing to compare Confirmed by BELFI  MD, MELANIE ([id number]) on  03/08/2014 1:59:09 PM      MDM   Final diagnoses:  Cough  Syncope, unspecified syncope type  Bronchitis   Patient presenting after a syncopal episode. She is in no apparent distress. Alert and oriented 3. No focal neurologic deficits. Mildly tachycardic on arrival. Afebrile. She does have a productive cough. Lungs clear. She states she believes she may have been dehydrated. Labs obtained, leukocytosis of 19.2. Otherwise no acute findings. Normal BUN and creatinine, no ketones in her urine, doubt dehydration. Chest x-ray ordered to evaluate for possible pneumonia. Chest x-ray negative for pneumonia. Given patient's symptoms of productive cough, congestion and chills, doubt PE. Patient is requesting to be discharged home. Patient is stable for discharge, will discharge with azithromycin for bronchitis. Return precautions given. Patient states understanding of care plan and is agreeable.  Discussed with attending Dr. Fredderick PhenixBelfi who also evaluated patient and agrees with plan of care.    Kathrynn SpeedRobyn M Aireonna Bauer, PA-C 03/08/14 2110  Rolan BuccoMelanie Belfi, MD 03/09/14 [phone]

## 2014-03-08 NOTE — Discharge Instructions (Signed)
Take antibiotic to completion. Stay well hydrated.  Syncope Syncope is a medical term for fainting or passing out. This means you lose consciousness and drop to the ground. People are generally unconscious for less than 5 minutes. You may have some muscle twitches for up to 15 seconds before waking up and returning to normal. Syncope occurs more often in older adults, but it can happen to anyone. While most causes of syncope are not dangerous, syncope can be a sign of a serious medical problem. It is important to seek medical care.  CAUSES  Syncope is caused by a sudden drop in blood flow to the brain. The specific cause is often not determined. Factors that can bring on syncope include:  Taking medicines that lower blood pressure.  Sudden changes in posture, such as standing up quickly.  Taking more medicine than prescribed.  Standing in one place for too long.  Seizure disorders.  Dehydration and excessive exposure to heat.  Low blood sugar (hypoglycemia).  Straining to have a bowel movement.  Heart disease, irregular heartbeat, or other circulatory problems.  Fear, emotional distress, seeing blood, or severe pain. SYMPTOMS  Right before fainting, you may:  Feel dizzy or light-headed.  Feel nauseous.  See all white or all black in your field of vision.  Have cold, clammy skin. DIAGNOSIS  Your health care provider will ask about your symptoms, perform a physical exam, and perform an electrocardiogram (ECG) to record the electrical activity of your heart. Your health care provider may also perform other heart or blood tests to determine the cause of your syncope which may include:  Transthoracic echocardiogram (TTE). During echocardiography, sound waves are used to evaluate how blood flows through your heart.  Transesophageal echocardiogram (TEE).  Cardiac monitoring. This allows your health care provider to monitor your heart rate and rhythm in real time.  Holter  monitor. This is a portable device that records your heartbeat and can help diagnose heart arrhythmias. It allows your health care provider to track your heart activity for several days, if needed.  Stress tests by exercise or by giving medicine that makes the heart beat faster. TREATMENT  In most cases, no treatment is needed. Depending on the cause of your syncope, your health care provider may recommend changing or stopping some of your medicines. HOME CARE INSTRUCTIONS  Have someone stay with you until you feel stable.  Do not drive, use machinery, or play sports until your health care provider says it is okay.  Keep all follow-up appointments as directed by your health care provider.  Lie down right away if you start feeling like you might faint. Breathe deeply and steadily. Wait until all the symptoms have passed.  Drink enough fluids to keep your urine clear or pale yellow.  If you are taking blood pressure or heart medicine, get up slowly and take several minutes to sit and then stand. This can reduce dizziness. SEEK IMMEDIATE MEDICAL CARE IF:   You have a severe headache.  You have unusual pain in the chest, abdomen, or back.  You are bleeding from your mouth or rectum, or you have black or tarry stool.  You have an irregular or very fast heartbeat.  You have pain with breathing.  You have repeated fainting or seizure-like jerking during an episode.  You faint when sitting or lying down.  You have confusion.  You have trouble walking.  You have severe weakness.  You have vision problems. If you fainted, call your local  emergency services (911 in [place] Do not drive yourself to the hospital.  MAKE SURE YOU:  Understand these instructions.  Will watch your condition.  Will get help right away if you are not doing well or get worse. Document Released: 04/06/2005 Document Revised: 04/11/2013 Document Reviewed: 06/05/2011 [facility] Patient Information 2015  [place], [place]. This information is not intended to replace advice given to you by your health care provider. Make sure you discuss any questions you have with your health care provider.   Cough, Adult  A cough is a reflex that helps clear your throat and airways. It can help heal the body or may be a reaction to an irritated airway. A cough may only last 2 or 3 weeks (acute) or may last more than 8 weeks (chronic).  CAUSES Acute cough:  Viral or bacterial infections. Chronic cough:  Infections.  Allergies.  Asthma.  Post-nasal drip.  Smoking.  Heartburn or acid reflux.  Some medicines.  Chronic lung problems (COPD).  Cancer. SYMPTOMS   Cough.  Fever.  Chest pain.  Increased breathing rate.  High-pitched whistling sound when breathing (wheezing).  Colored mucus that you cough up (sputum). TREATMENT   A bacterial cough may be treated with antibiotic medicine.  A viral cough must run its course and will not respond to antibiotics.  Your caregiver may recommend other treatments if you have a chronic cough. HOME CARE INSTRUCTIONS   Only take over-the-counter or prescription medicines for pain, discomfort, or fever as directed by your caregiver. Use cough suppressants only as directed by your caregiver.  Use a cold steam vaporizer or humidifier in your bedroom or home to help loosen secretions.  Sleep in a semi-upright position if your cough is worse at night.  Rest as needed.  Stop smoking if you smoke. SEEK IMMEDIATE MEDICAL CARE IF:   You have pus in your sputum.  Your cough starts to worsen.  You cannot control your cough with suppressants and are losing sleep.  You begin coughing up blood.  You have difficulty breathing.  You develop pain which is getting worse or is uncontrolled with medicine.  You have a fever. MAKE SURE YOU:   Understand these instructions.  Will watch your condition.  Will get help right away if you are not doing well  or get worse. Document Released: 10/03/2010 Document Revised: 06/29/2011 Document Reviewed: 10/03/2010 [facility] Patient Information 2015 [place]Care, [place]LL[place]. This information is not intended to replace advice given to you by your health care provider. Make sure you discuss any questions you have with your health care provider.

## 2014-03-08 NOTE — ED Notes (Signed)
Pt. Was in [employer] doing drills and was reporting she was sick last night with fever and chills with loss of appetite last night.  No big breakfast today.  Doing drills today stood from seated position and became dizzy.  Pt. Had ortostatic with increased HR during episode with EMS on scene.  Pt. Dizzy upon EMS arrival.  NS  22g in L hand.   112/74  HR 114.    A/O  With no distress noted.

## 2015-04-21 NOTE — L&D Delivery Note (Signed)
Spontaneous vaginal delivery of viable female infant @ 0358 Apgars 9/10 over intact perineum with epidural anesthesia. Spontaneous delivery of intact placenta with 3 vessel cord. 2nd degree laceration repaired with 3-0 Vicryl. EBL- 200  [facility]

## 2015-06-19 LAB — HM PAP SMEAR: HM Pap smear: NORMAL

## 2015-06-29 ENCOUNTER — Encounter (HOSPITAL_COMMUNITY): Payer: Self-pay | Admitting: Emergency Medicine

## 2015-06-29 ENCOUNTER — Inpatient Hospital Stay (HOSPITAL_COMMUNITY)
Admission: EM | Admit: 2015-06-29 | Discharge: 2015-06-30 | Disposition: A | Discharge status: Home / Self Care | Payer: Medicaid Other | Attending: Obstetrics and Gynecology | Admitting: Obstetrics and Gynecology

## 2015-06-29 ENCOUNTER — Emergency Department (HOSPITAL_COMMUNITY): Payer: Medicaid Other

## 2015-06-29 DIAGNOSIS — Z8742 Personal history of other diseases of the female genital tract: Secondary | ICD-10-CM

## 2015-06-29 DIAGNOSIS — Z3A13 13 weeks gestation of pregnancy: Secondary | ICD-10-CM | POA: Insufficient documentation

## 2015-06-29 DIAGNOSIS — F909 Attention-deficit hyperactivity disorder, unspecified type: Secondary | ICD-10-CM | POA: Diagnosis not present

## 2015-06-29 DIAGNOSIS — Z331 Pregnant state, incidental: Secondary | ICD-10-CM | POA: Diagnosis present

## 2015-06-29 DIAGNOSIS — Z79899 Other long term (current) drug therapy: Secondary | ICD-10-CM | POA: Insufficient documentation

## 2015-06-29 DIAGNOSIS — R1032 Left lower quadrant pain: Secondary | ICD-10-CM

## 2015-06-29 DIAGNOSIS — B373 Candidiasis of vulva and vagina: Secondary | ICD-10-CM | POA: Diagnosis present

## 2015-06-29 DIAGNOSIS — F17200 Nicotine dependence, unspecified, uncomplicated: Secondary | ICD-10-CM

## 2015-06-29 DIAGNOSIS — B3731 Acute candidiasis of vulva and vagina: Secondary | ICD-10-CM

## 2015-06-29 DIAGNOSIS — O98811 Other maternal infectious and parasitic diseases complicating pregnancy, first trimester: Secondary | ICD-10-CM | POA: Insufficient documentation

## 2015-06-29 DIAGNOSIS — Z87891 Personal history of nicotine dependence: Secondary | ICD-10-CM | POA: Insufficient documentation

## 2015-06-29 DIAGNOSIS — Z8659 Personal history of other mental and behavioral disorders: Secondary | ICD-10-CM

## 2015-06-29 DIAGNOSIS — Z3491 Encounter for supervision of normal pregnancy, unspecified, first trimester: Secondary | ICD-10-CM

## 2015-06-29 DIAGNOSIS — O26891 Other specified pregnancy related conditions, first trimester: Secondary | ICD-10-CM | POA: Diagnosis not present

## 2015-06-29 DIAGNOSIS — O99341 Other mental disorders complicating pregnancy, first trimester: Secondary | ICD-10-CM | POA: Insufficient documentation

## 2015-06-29 LAB — I-STAT BETA HCG BLOOD, ED (MC, WL, AP ONLY): I-stat hCG, quantitative: >2000 m[IU]/mL — ABNORMAL HIGH (ref <5)

## 2015-06-29 LAB — COMPREHENSIVE METABOLIC PANEL
ALBUMIN: 3.4 g/dL — AB (ref 3.5–5.0)
ALT: 16 U/L (ref 14–54)
ANION GAP: 13 (ref 5–15)
AST: 25 U/L (ref 15–41)
Alkaline Phosphatase: 37 U/L — ABNORMAL LOW (ref 38–126)
BILIRUBIN TOTAL: 0.5 mg/dL (ref 0.3–1.2)
BUN: 8 mg/dL (ref 6–20)
CO2: 19 mmol/L — ABNORMAL LOW (ref 22–32)
Calcium: 8.8 mg/dL — ABNORMAL LOW (ref 8.9–10.3)
Chloride: 105 mmol/L (ref 101–111)
Creatinine, Ser: 0.63 mg/dL (ref 0.44–1.00)
GFR calc Af Amer: >60 mL/min (ref >60)
GFR calc non Af Amer: >60 mL/min (ref >60)
GLUCOSE: 115 mg/dL — AB (ref 65–99)
POTASSIUM: 3.9 mmol/L (ref 3.5–5.1)
SODIUM: 137 mmol/L (ref 135–145)
TOTAL PROTEIN: 6.4 g/dL — AB (ref 6.5–8.1)

## 2015-06-29 LAB — CBC WITH DIFFERENTIAL/PLATELET
BASOS PCT: 1 %
Basophils Absolute: 0.1 10*3/uL (ref 0.0–0.1)
EOS ABS: 0.1 10*3/uL (ref 0.0–0.7)
EOS PCT: 0 %
HEMATOCRIT: 34.6 % — AB (ref 36.0–46.0)
Hemoglobin: 12.3 g/dL (ref 12.0–15.0)
Lymphocytes Relative: 21 %
Lymphs Abs: 3 10*3/uL (ref 0.7–4.0)
MCH: 33.2 pg (ref 26.0–34.0)
MCHC: 35.5 g/dL (ref 30.0–36.0)
MCV: 93.3 fL (ref 78.0–100.0)
MONO ABS: 1.3 10*3/uL — AB (ref 0.1–1.0)
MONOS PCT: 9 %
Neutro Abs: 9.9 10*3/uL — ABNORMAL HIGH (ref 1.7–7.7)
Neutrophils Relative %: 69 %
Platelets: 241 10*3/uL (ref 150–400)
RBC: 3.71 MIL/uL — ABNORMAL LOW (ref 3.87–5.11)
RDW: 12.4 % (ref 11.5–15.5)
WBC: 14.4 10*3/uL — ABNORMAL HIGH (ref 4.0–10.5)

## 2015-06-29 LAB — URINALYSIS, ROUTINE W REFLEX MICROSCOPIC
BILIRUBIN URINE: NEGATIVE
GLUCOSE, UA: NEGATIVE mg/dL
HGB URINE DIPSTICK: NEGATIVE
KETONES UR: NEGATIVE mg/dL
Nitrite: NEGATIVE
PH: 6 (ref 5.0–8.0)
Protein, ur: NEGATIVE mg/dL
Specific Gravity, Urine: 1.009 (ref 1.005–1.030)

## 2015-06-29 LAB — WET PREP, GENITAL
CLUE CELLS WET PREP: NONE SEEN
SPERM: NONE SEEN
TRICH WET PREP: NONE SEEN

## 2015-06-29 LAB — I-STAT CG4 LACTIC ACID, ED: LACTIC ACID, VENOUS: 1.5 mmol/L (ref 0.5–2.0)

## 2015-06-29 LAB — I-STAT CHEM 8, ED
BUN: 8 mg/dL (ref 6–20)
CHLORIDE: 105 mmol/L (ref 101–111)
CREATININE: 0.5 mg/dL (ref 0.44–1.00)
Calcium, Ion: 1.16 mmol/L (ref 1.12–1.23)
GLUCOSE: 94 mg/dL (ref 65–99)
HEMATOCRIT: 35 % — AB (ref 36.0–46.0)
HEMOGLOBIN: 11.9 g/dL — AB (ref 12.0–15.0)
POTASSIUM: 3.6 mmol/L (ref 3.5–5.1)
Sodium: 141 mmol/L (ref 135–145)
TCO2: 21 mmol/L (ref 0–100)

## 2015-06-29 LAB — URINE MICROSCOPIC-ADD ON

## 2015-06-29 LAB — HCG, QUANTITATIVE, PREGNANCY: HCG, BETA CHAIN, QUANT, S: 37350 m[IU]/mL — AB (ref <5)

## 2015-06-29 MED ORDER — MORPHINE SULFATE (PF) 4 MG/ML IV SOLN
4.0000 mg | Freq: Once | INTRAVENOUS | Status: AC
  Administered 2015-06-30: 4 mg via INTRAVENOUS
  Filled 2015-06-29: qty 1

## 2015-06-29 MED ORDER — ACETAMINOPHEN 500 MG PO TABS
1000.0000 mg | ORAL_TABLET | Freq: Once | ORAL | Status: AC
  Administered 2015-06-29: 1000 mg via ORAL
  Filled 2015-06-29: qty 2

## 2015-06-29 MED ORDER — ONDANSETRON HCL 4 MG/2ML IJ SOLN
4.0000 mg | Freq: Once | INTRAMUSCULAR | Status: AC
  Administered 2015-06-30: 4 mg via INTRAVENOUS
  Filled 2015-06-29: qty 2

## 2015-06-29 MED ORDER — SODIUM CHLORIDE 0.9 % IV BOLUS (SEPSIS)
1000.0000 mL | Freq: Once | INTRAVENOUS | Status: AC
  Administered 2015-06-29: 1000 mL via INTRAVENOUS

## 2015-06-29 NOTE — ED Notes (Signed)
Pt to ER by private vehicle with sister. Pt reports sudden onset lower quadrant abdominal pain about an hour ago while she was walking. Pt is [redacted] weeks pregnant. No bleeding noted. A/o x4.

## 2015-06-29 NOTE — ED Provider Notes (Signed)
CSN: [id number]     Arrival date & time 06/29/15  1720 History   First MD Initiated Contact with Patient 06/29/15 1725     Chief Complaint  Patient presents with  . Abdominal Pain    [redacted] WEEKS PREGNANT     (Consider location/radiation/quality/duration/timing/severity/associated sxs/prior Treatment) HPI   Blood pressure 106/95, pulse 97, temperature 99.2 F (37.3 C), temperature source Oral, resp. rate 24, SpO2 99 %.  [name] is a 30 y.o. female G2P0 complaining of severe colicky left lower quadrant abdominal pain onset 1 hour ago while she was walking. No pain medication taken prior to arrival. She patient is [redacted] weeks pregnant, also with OB/GYN, unconjugated pregnancy, she had ultrasound at week 5. Patient denies trauma, vaginal bleeding, abnormal vaginal discharge states her urine is slightly darker than normal with no dysuria or hematuria, she denies history of kidney stones. + history of ovarian.    Past Medical History  Diagnosis Date  . ADHD (attention deficit hyperactivity disorder)    No past surgical history on file. No family history on file. Social History  Substance Use Topics  . Smoking status: Not on file  . Smokeless tobacco: Not on file  . Alcohol Use: No   OB History    No data available     Review of Systems  10 systems reviewed and found to be negative, except as noted in the HPI.   Allergies  Review of patient's allergies indicates no known allergies.  Home Medications   Prior to Admission medications   Medication Sig Start Date End Date Taking? Authorizing Provider  amphetamine-dextroamphetamine (ADDERALL XR) 5 MG 24 hr capsule Take 5 mg by mouth daily.    Historical Provider, MD  azithromycin (ZITHROMAX) 250 MG tablet Take 1 tablet (250 mg total) by mouth daily. Take first 2 tablets together, then 1 every day until finished. 03/08/14   Robyn M Hess, PA-C   BP 106/95 mmHg  Pulse 97  Temp(Src) 99.2 F (37.3 C) (Oral)  Resp 24  SpO2  99% Physical Exam  Constitutional: She is oriented to person, place, and time. She appears well-developed and well-nourished.  Severe pain, writhing, holding left lower quadrant.  HENT:  Head: Normocephalic.  Eyes: Conjunctivae and EOM are normal.  Cardiovascular: Normal rate, regular rhythm and intact distal pulses.   Pulmonary/Chest: Effort normal and breath sounds normal. No stridor. No respiratory distress. She has no wheezes. She has no rales. She exhibits no tenderness.  Abdominal: Soft. Bowel sounds are normal. There is tenderness.  Genitourinary:  Pelvic exam a chaperoned by nurse Clydie BraunKaren: No rashes or lesions, there is white, non-foul-smelling, yeastlike intravaginal discharge. Patient with left-sided tenderness palpation I think  Musculoskeletal: Normal range of motion.  Neurological: She is alert and oriented to person, place, and time.  Psychiatric: She has a normal mood and affect.  Nursing note and vitals reviewed.   ED Course  Procedures (including critical care time) Labs Review Labs Reviewed  WET PREP, GENITAL - Abnormal; Notable for the following:    Yeast Wet Prep HPF POC PRESENT (*)    WBC, Wet Prep HPF POC MANY (*)    All other components within normal limits  CBC WITH DIFFERENTIAL/PLATELET - Abnormal; Notable for the following:    WBC 14.4 (*)    RBC 3.71 (*)    HCT 34.6 (*)    Neutro Abs 9.9 (*)    Monocytes Absolute 1.3 (*)    All other components within normal limits  COMPREHENSIVE  METABOLIC PANEL - Abnormal; Notable for the following:    CO2 19 (*)    Glucose, Bld 115 (*)    Calcium 8.8 (*)    Total Protein 6.4 (*)    Albumin 3.4 (*)    Alkaline Phosphatase 37 (*)    All other components within normal limits  I-STAT BETA HCG BLOOD, ED ([facility], [facility], [facility] ONLY) - Abnormal; Notable for the following:    I-stat hCG, quantitative >2000.0 (*)    All other components within normal limits  URINALYSIS, ROUTINE W REFLEX MICROSCOPIC (NOT AT [facility])  HCG, QUANTITATIVE,  PREGNANCY  I-STAT CG4 LACTIC ACID, ED  GC/CHLAMYDIA PROBE AMP ([place]) NOT AT [facility]    Imaging Review US Ob Comp Less 14 Wks  06/29/2015  CLINICAL DATA:  Left lower quadrant pain since this afternoon. EXAM: OBSTETRIC <14 WK [place] US DOPPLER ULTRASOUND OF OVARIES TECHNIQUE: Both transabdominal and transvaginal ultrasound examinations were performed for complete evaluation of the gestation as well as the maternal uterus, adnexal regions, and pelvic cul-de-sac. Transvaginal technique was performed to assess early pregnancy. Color and duplex Doppler ultrasound was utilized to evaluate blood flow to the ovaries. COMPARISON:  None. FINDINGS: Intrauterine gestational sac: Single. Yolk sac:  Not visualized. Embryo:  Visualized. Cardiac Activity: Visualized. Heart Rate: 163 bpm CRL:   77  Mm   13 w 5 d                  [place] EDC: 12/30/2015. Subchorionic hemorrhage:  None visualized. Maternal uterus/adnexae: The maternal right ovary measures 3.4 x 2.0 x 2.0 cm and has normal sonographic features. Low resistance arterial and venous waveforms are detectable within the right ovary on pulsed Doppler evaluation. Left ovary could not be visualized on transabdominal scanning and the patient was unable to tolerate endovaginal evaluation. IMPRESSION: 1. Single living intrauterine gestation at estimated 13 week 5 day gestational age. 2. Normal right ovary. 3. Left ovary could not be identified on transvaginal scanning. Sonographer was unable to form endovaginal scanning due to patient discomfort. Electronically Signed   By: Kennith Center M.D.   On: 06/29/2015 19:13   US Renal  06/29/2015  CLINICAL DATA:  Left flank and upper quadrant pain since this afternoon EXAM: RENAL / URINARY TRACT ULTRASOUND COMPLETE COMPARISON:  CT 09/03/2011 FINDINGS: Right Kidney: Length: 11.2 cm. Echogenicity within normal limits. No mass or hydronephrosis visualized. Left Kidney: Length: 11.8 cm. Echogenicity within normal limits. No mass or  hydronephrosis visualized. Bladder: Appears normal for degree of bladder distention. IMPRESSION: Normal renal ultrasound Electronically Signed   By: Charlett Nose M.D.   On: 06/29/2015 18:48   [place] Art/ven Flow Abd Pelv Doppler  06/29/2015  CLINICAL DATA:  Left lower quadrant pain since this afternoon. EXAM: OBSTETRIC <14 WK [place] US DOPPLER ULTRASOUND OF OVARIES TECHNIQUE: Both transabdominal and transvaginal ultrasound examinations were performed for complete evaluation of the gestation as well as the maternal uterus, adnexal regions, and pelvic cul-de-sac. Transvaginal technique was performed to assess early pregnancy. Color and duplex Doppler ultrasound was utilized to evaluate blood flow to the ovaries. COMPARISON:  None. FINDINGS: Intrauterine gestational sac: Single. Yolk sac:  Not visualized. Embryo:  Visualized. Cardiac Activity: Visualized. Heart Rate: 163 bpm CRL:   77  Mm   13 w 5 d                  [place] EDC: 12/30/2015. Subchorionic hemorrhage:  None visualized. Maternal uterus/adnexae: The maternal right ovary measures 3.4 x 2.0  x 2.0 cm and has normal sonographic features. Low resistance arterial and venous waveforms are detectable within the right ovary on pulsed Doppler evaluation. Left ovary could not be visualized on transabdominal scanning and the patient was unable to tolerate endovaginal evaluation. IMPRESSION: 1. Single living intrauterine gestation at estimated 13 week 5 day gestational age. 2. Normal right ovary. 3. Left ovary could not be identified on transvaginal scanning. Sonographer was unable to form endovaginal scanning due to patient discomfort. Electronically Signed   By: Kennith Center M.D.   On: 06/29/2015 19:13   I have personally reviewed and evaluated these images and lab results as part of my medical decision-making.   EKG Interpretation None      MDM   Final diagnoses:  LLQ pain  First trimester pregnancy  Vaginal yeast infection   Filed Vitals:   06/29/15 1731   BP: 106/95  Pulse: 97  Temp: 99.2 F (37.3 C)  TempSrc: Oral  Resp: 24  SpO2: 99%    Medications  acetaminophen (TYLENOL) tablet 1,000 mg (not administered)  sodium chloride 0.9 % bolus 1,000 mL (0 mLs Intravenous Stopped 06/29/15 1915)    [name] is 30 y.o. female presenting with severe colicky, acute left lower quadrant abdominal pain, she's [redacted] weeks pregnant. There's been no trauma. She has had OB/GYN evaluation, this is an uncomplicated pregnancy. She has no history of kidney stones. She does have a history of ovarian cysts. Concern for portion versus nephrolithiasis.  Ultrasound shows normal gestation, no renal abnormality. Unfortunately, the left ovary was not able to be visualized in a transabdominal exam. When patient returned from imaging suite she states that her pain had largely resolved.  Case discussed with [name] from [facility] who except transfers to [facility] hospital for evaluation of intermittent torsion. Patient's wet prep with yeast however patient is asymptomatic, asked Pt and her mother to discuss treatment with Ob/Gyn. Patient remains pain-free, afebrile with stable vital signs prior to transfer.   Joni Reining Gurshaan Matsuoka, PA-C 06/29/15 2149  Derwood Kaplan, MD 06/30/15 1610

## 2015-06-30 ENCOUNTER — Inpatient Hospital Stay (HOSPITAL_COMMUNITY): Payer: Medicaid Other

## 2015-06-30 ENCOUNTER — Encounter (HOSPITAL_COMMUNITY): Payer: Self-pay | Admitting: *Deleted

## 2015-06-30 DIAGNOSIS — B3731 Acute candidiasis of vulva and vagina: Secondary | ICD-10-CM

## 2015-06-30 DIAGNOSIS — F17200 Nicotine dependence, unspecified, uncomplicated: Secondary | ICD-10-CM

## 2015-06-30 DIAGNOSIS — B373 Candidiasis of vulva and vagina: Secondary | ICD-10-CM

## 2015-06-30 DIAGNOSIS — R1032 Left lower quadrant pain: Secondary | ICD-10-CM

## 2015-06-30 DIAGNOSIS — Z8659 Personal history of other mental and behavioral disorders: Secondary | ICD-10-CM

## 2015-06-30 DIAGNOSIS — Z8742 Personal history of other diseases of the female genital tract: Secondary | ICD-10-CM

## 2015-06-30 LAB — I-STAT CG4 LACTIC ACID, ED: Lactic Acid, Venous: 1.78 mmol/L (ref 0.5–2.0)

## 2015-06-30 NOTE — Progress Notes (Signed)
Sherre ScarletKimberly Williams CNM in earlier to discuss u/s results and d/c plan. Written and verbal d/c instructions given and understanding voiced

## 2015-06-30 NOTE — MAU Provider Note (Signed)
History  30 yo G1P0 @ 13.4 wks by sure LMP (EDD 01/01/16) presents to MAU from [facility] for further evaluation of severe colicky, acute left lower quadrant abdominal pain. [facility] showed normal gestation and no renal abnormality. Left ovary not visualized. Pain comes and goes per pt report. Also reports h/o ovarian cyst. Was given IV Morphine prior to transfer w/ good result (rates pain now as 5/10). Provider at [facility] concerned about intermittent torsion.   Yeast vaginitis per wet prep and UA at [facility]; pt asymptomatic.  Upon questioning about recent activities, states lifted Dalmatian from floor to bed.  Patient Active Problem List   Diagnosis Date Noted  . Colicky left lower quadrant pain 06/30/2015  . Yeast vaginitis 06/30/2015  . Smoker 06/30/2015  . Hx of ovarian cyst 06/30/2015  . History of ADHD 06/30/2015    Chief Complaint  Patient presents with  . Abdominal Pain    [redacted] WEEKS PREGNANT   HPI As above OB History    Gravida Para Term Preterm AB TAB SAB Ectopic Multiple Living   2    1     0      Past Medical History  Diagnosis Date  . ADHD (attention deficit hyperactivity disorder)     Past Surgical History  Procedure Laterality Date  . No past surgeries      Family History  Problem Relation Age of Onset  . Stroke Maternal Grandmother   . Cancer Maternal Grandfather   . Stroke Maternal Grandfather     Social History  Substance Use Topics  . Smoking status: Former Games developermoker  . Smokeless tobacco: None  . Alcohol Use: No    Allergies: No Known Allergies  No prescriptions prior to admission    ROS  LLQ pain Physical Exam   Results for orders placed or performed during the hospital encounter of 06/29/15 (from the past 24 hour(s))  I-Stat CG4 Lactic Acid, ED     Status: None   Collection Time: 06/29/15 11:40 PM  Result Value Ref Range   Lactic Acid, Venous 1.78 0.5 - 2.0 mmol/L    Blood pressure 99/52, pulse 75, temperature 98.4 F  (36.9 C), temperature source Oral, resp. rate 20, SpO2 100 %.    Physical Exam Gen: NAD Lungs: CTAB CV: RRR w/o M/R/G Abdomen: gravid, soft, tender in left lower quadrant, non tender on right, no rebound or guarding Ext: WNL ED Course  Assessment: LLQ pain   Plan: Repeat u/s  Sherre ScarletWILLIAMS, Mirranda Monrroy CNM, MS 06/30/2015 10:40 PM    ADDENDUM: CLINICAL DATA: Intermittent left lower quadrant pain. Rule out torsion. Patient in first-trimester pregnancy.  EXAM: LIMITED ULTRASOUND OF PELVIS  DOPPLER ULTRASOUND OF OVARIES  TECHNIQUE: Limited transabdominal and transvaginal ultrasound examination of the pelvis was performed to evaluate the ovaries and adnexa regions only.  Color and duplex Doppler ultrasound was utilized to evaluate blood flow to the ovaries.  COMPARISON: Trans abdominal ultrasound 6 hours prior.  FINDINGS: Right ovary  Measurements: 2.6 x 1.7 x 1.4 cm. Normal appearance/no adnexal mass. Blood flow seen.  Left ovary  Measurements: 4.4 x 3.0 x 2.2 cm. Normal appearance/no adnexal mass. Blood flow is seen.  Pulsed Doppler evaluation demonstrates normal low-resistance arterial and venous waveforms in both ovaries.  Gravid uterus evaluated previously.  IMPRESSION: Left ovary is normal with normal blood flow. Right ovary is also normal.   Electronically Signed  By: Rubye OaksMelanie Ehinger M.D.  On: 06/30/2015 03:17   Pain likely round ligament  in nature (discussed in detail) and from lifting dog. Comfort measures reviewed. Encouraged to avoid heavy lifting. Out of work Monday, 07/01/15 Rest Hydrate Advised OTC Clotrimazole vaginal cream x 7 nights for yeast vaginitis Tylenol prn Keep office appt on Tuesday, 07/02/15 SAB precautions    [name], CNM 06/30/15, 2:30 AM

## 2015-06-30 NOTE — MAU Note (Signed)
recived [facility]. With possible left ovarian torsion. Pt reating pain 5/10 currently v/ss.

## 2015-06-30 NOTE — Discharge Instructions (Signed)
Round Ligament Pain  The round ligament is a cord of muscle and tissue that helps to support the uterus. It can become a source of pain during pregnancy if it becomes stretched or twisted as the baby grows. The pain usually begins in the second trimester of pregnancy, and it can come and go until the baby is delivered. It is not a serious problem, and it does not cause harm to the baby.  Round ligament pain is usually a short, sharp, and pinching pain, but it can also be a dull, lingering, and aching pain. The pain is felt in the lower side of the abdomen or in the groin. It usually starts deep in the groin and moves up to the outside of the hip area. Pain can occur with:   A sudden change in position.   Rolling over in bed.   Coughing or sneezing.   Physical activity.  HOME CARE INSTRUCTIONS  Watch your condition for any changes. Take these steps to help with your pain:   When the pain starts, relax. Then try:    Sitting down.    Flexing your knees up to your abdomen.    Lying on your side with one pillow under your abdomen and another pillow between your legs.    Sitting in a warm bath for 15-20 minutes or until the pain goes away.   Take over-the-counter and prescription medicines only as told by your health care provider.   Move slowly when you sit and stand.   Avoid long walks if they cause pain.   Stop or lessen your physical activities if they cause pain.  SEEK MEDICAL CARE IF:   Your pain does not go away with treatment.   You feel pain in your back that you did not have before.   Your medicine is not helping.  SEEK IMMEDIATE MEDICAL CARE IF:   You develop a fever or chills.   You develop uterine contractions.   You develop vaginal bleeding.   You develop nausea or vomiting.   You develop diarrhea.   You have pain when you urinate.     This information is not intended to replace advice given to you by your health care provider. Make sure you discuss any questions you have with your health  care provider.     Document Released: 01/14/2008 Document Revised: 06/29/2011 Document Reviewed: 06/13/2014  Elsevier Interactive Patient Education 2016 [employer]vier Inc[employer]

## 2015-07-01 LAB — GC/CHLAMYDIA PROBE AMP (~~LOC~~) NOT AT ARMC
CHLAMYDIA, DNA PROBE: NEGATIVE
NEISSERIA GONORRHEA: NEGATIVE

## 2015-07-02 LAB — OB RESULTS CONSOLE GC/CHLAMYDIA
CHLAMYDIA, DNA PROBE: NEGATIVE
GC PROBE AMP, GENITAL: NEGATIVE

## 2015-07-02 LAB — OB RESULTS CONSOLE ANTIBODY SCREEN: ANTIBODY SCREEN: NEGATIVE

## 2015-07-02 LAB — OB RESULTS CONSOLE RUBELLA ANTIBODY, IGM: RUBELLA: IMMUNE

## 2015-07-02 LAB — OB RESULTS CONSOLE ABO/RH: RH Type: POSITIVE

## 2015-07-02 LAB — OB RESULTS CONSOLE HIV ANTIBODY (ROUTINE TESTING): HIV: NONREACTIVE

## 2015-07-02 LAB — OB RESULTS CONSOLE RPR: RPR: NONREACTIVE

## 2015-07-02 LAB — OB RESULTS CONSOLE HEPATITIS B SURFACE ANTIGEN: Hepatitis B Surface Ag: NEGATIVE

## 2015-07-11 ENCOUNTER — Inpatient Hospital Stay (HOSPITAL_COMMUNITY)
Admission: AD | Admit: 2015-07-11 | Discharge: 2015-07-11 | Disposition: A | Discharge status: Home / Self Care | Payer: Medicaid Other | Source: Ambulatory Visit | Attending: Obstetrics and Gynecology | Admitting: Obstetrics and Gynecology

## 2015-07-11 ENCOUNTER — Encounter (HOSPITAL_COMMUNITY): Payer: Self-pay | Admitting: *Deleted

## 2015-07-11 DIAGNOSIS — Z87891 Personal history of nicotine dependence: Secondary | ICD-10-CM | POA: Diagnosis not present

## 2015-07-11 DIAGNOSIS — R1032 Left lower quadrant pain: Secondary | ICD-10-CM | POA: Diagnosis present

## 2015-07-11 DIAGNOSIS — B373 Candidiasis of vulva and vagina: Secondary | ICD-10-CM | POA: Diagnosis not present

## 2015-07-11 DIAGNOSIS — Z3A15 15 weeks gestation of pregnancy: Secondary | ICD-10-CM | POA: Diagnosis not present

## 2015-07-11 DIAGNOSIS — O98812 Other maternal infectious and parasitic diseases complicating pregnancy, second trimester: Secondary | ICD-10-CM | POA: Diagnosis not present

## 2015-07-11 DIAGNOSIS — R1030 Lower abdominal pain, unspecified: Secondary | ICD-10-CM

## 2015-07-11 HISTORY — DX: Headache, unspecified: R51.9

## 2015-07-11 HISTORY — DX: Depression, unspecified: F32.A

## 2015-07-11 HISTORY — DX: Major depressive disorder, single episode, unspecified: F32.9

## 2015-07-11 HISTORY — DX: Anxiety disorder, unspecified: F41.9

## 2015-07-11 HISTORY — DX: Headache: R51

## 2015-07-11 LAB — URINALYSIS, ROUTINE W REFLEX MICROSCOPIC
Bilirubin Urine: NEGATIVE
GLUCOSE, UA: NEGATIVE mg/dL
Hgb urine dipstick: NEGATIVE
KETONES UR: NEGATIVE mg/dL
NITRITE: NEGATIVE
PROTEIN: NEGATIVE mg/dL
Specific Gravity, Urine: 1.01 (ref 1.005–1.030)
pH: 6.5 (ref 5.0–8.0)

## 2015-07-11 LAB — URINE MICROSCOPIC-ADD ON

## 2015-07-11 MED ORDER — NITROFURANTOIN MONOHYD MACRO 100 MG PO CAPS
100.0000 mg | ORAL_CAPSULE | Freq: Two times a day (BID) | ORAL | Status: AC
Start: 2015-07-11 — End: 2015-07-18

## 2015-07-11 MED ORDER — CYCLOBENZAPRINE HCL 10 MG PO TABS: 10.0000 mg | ORAL_TABLET | Freq: Three times a day (TID) | ORAL | Status: DC | PRN

## 2015-07-11 NOTE — MAU Note (Signed)
Pt presents to MAU with complaints of sharp pains in her lower abdomen. Denies any vaginal bleeding or abnormal discharge.

## 2015-07-11 NOTE — MAU Provider Note (Signed)
[name] is a 30 y.o. G2P0010 at 15.1 weeks seen in [place] on 06/30/15 for left LQ pain and given morphine.  She was transferred and diagnosed as round ligament pain (from lifting a Dalmatian from floor to bed) and sent home.  The [place] was unremarkable. The UA was remarkable for bacteria, yeast and moderate leukocytes.  No note of a culture beng sent   History     Patient Active Problem List   Diagnosis Date Noted  . Colicky left lower quadrant pain 06/30/2015  . Yeast vaginitis 06/30/2015  . Smoker 06/30/2015  . Hx of ovarian cyst 06/30/2015  . History of ADHD 06/30/2015    Chief Complaint  Patient presents with  . Abdominal Pain   HPI  OB History    Gravida Para Term Preterm AB TAB SAB Ectopic Multiple Living   2    1     0      Past Medical History  Diagnosis Date  . ADHD (attention deficit hyperactivity disorder)   . Headache   . Depression   . Anxiety     Past Surgical History  Procedure Laterality Date  . No past surgeries      Family History  Problem Relation Age of Onset  . Stroke Maternal Grandmother   . Cancer Maternal Grandfather   . Stroke Maternal Grandfather     Social History  Substance Use Topics  . Smoking status: Former Games developer  . Smokeless tobacco: None  . Alcohol Use: No    Allergies: No Known Allergies  Prescriptions prior to admission  Medication Sig Dispense Refill Last Dose  . Prenatal Vit-Fe Fumarate-FA (PRENATAL MULTIVITAMIN) TABS tablet Take 1 tablet by mouth daily at 12 noon.   Past Week at Unknown time    ROS See HPI above, all other systems are negative  Physical Exam   Blood pressure 116/68, pulse 86, temperature 98.5 F (36.9 C), resp. rate 18.  Physical Exam Ext:  WNL ABD: Soft, non tender to palpation, no rebound or guarding SVE: C/T/H   ED Course  Assessment: IUP at  15.1weeks Membranes: intact FHR: 155 CTX:  none   MAU Addendum Note  Results for orders placed or performed during the hospital  encounter of 07/11/15 (from the past 24 hour(s))  Urinalysis, Routine w reflex microscopic (not at [facility])     Status: Abnormal   Collection Time: 07/11/15 12:40 PM  Result Value Ref Range   Color, Urine YELLOW YELLOW   APPearance CLEAR CLEAR   Specific Gravity, Urine 1.010 1.005 - 1.030   pH 6.5 5.0 - 8.0   Glucose, UA NEGATIVE NEGATIVE mg/dL   Hgb urine dipstick NEGATIVE NEGATIVE   Bilirubin Urine NEGATIVE NEGATIVE   Ketones, ur NEGATIVE NEGATIVE mg/dL   Protein, ur NEGATIVE NEGATIVE mg/dL   Nitrite NEGATIVE NEGATIVE   Leukocytes, UA MODERATE (A) NEGATIVE  Urine microscopic-add on     Status: Abnormal   Collection Time: 07/11/15 12:40 PM  Result Value Ref Range   Squamous Epithelial / LPF 0-5 (A) NONE SEEN   WBC, UA 6-30 0 - 5 WBC/hpf   RBC / HPF 0-5 0 - 5 RBC/hpf   Bacteria, UA MANY (A) NONE SEEN   Confirmed yeast Suspected UTI   Plan: -Rx for Macrobid BID x 7 -flexeril  TID PRN -Discussed need to follow up in office on next ROB -Bleeding and PTL Precautions -Encouraged to call if any questions or concerns arise prior to next scheduled office visit.  -  Discharged to home in stable condition -Consulted with Dr. Maureen Ralphsillard   Kenita Bines, CNM, MSN 07/11/2015. 1:23 PM

## 2015-11-21 LAB — OB RESULTS CONSOLE GBS: STREP GROUP B AG: POSITIVE

## 2015-11-29 ENCOUNTER — Encounter (HOSPITAL_COMMUNITY): Payer: Self-pay | Admitting: *Deleted

## 2015-11-29 ENCOUNTER — Inpatient Hospital Stay (HOSPITAL_COMMUNITY)
Admission: AD | Admit: 2015-11-29 | Discharge: 2015-11-29 | Disposition: A | Discharge status: Home / Self Care | Payer: Medicaid Other | Source: Ambulatory Visit | Attending: Obstetrics and Gynecology | Admitting: Obstetrics and Gynecology

## 2015-11-29 DIAGNOSIS — Z87891 Personal history of nicotine dependence: Secondary | ICD-10-CM | POA: Insufficient documentation

## 2015-11-29 DIAGNOSIS — F419 Anxiety disorder, unspecified: Secondary | ICD-10-CM | POA: Insufficient documentation

## 2015-11-29 DIAGNOSIS — F909 Attention-deficit hyperactivity disorder, unspecified type: Secondary | ICD-10-CM | POA: Insufficient documentation

## 2015-11-29 DIAGNOSIS — F329 Major depressive disorder, single episode, unspecified: Secondary | ICD-10-CM | POA: Insufficient documentation

## 2015-11-29 DIAGNOSIS — Z3A37 37 weeks gestation of pregnancy: Secondary | ICD-10-CM | POA: Diagnosis not present

## 2015-11-29 DIAGNOSIS — O99343 Other mental disorders complicating pregnancy, third trimester: Secondary | ICD-10-CM | POA: Insufficient documentation

## 2015-11-29 DIAGNOSIS — O471 False labor at or after 37 completed weeks of gestation: Secondary | ICD-10-CM | POA: Diagnosis not present

## 2015-11-29 LAB — URINALYSIS, ROUTINE W REFLEX MICROSCOPIC
Bilirubin Urine: NEGATIVE
Glucose, UA: NEGATIVE mg/dL
Hgb urine dipstick: NEGATIVE
KETONES UR: NEGATIVE mg/dL
LEUKOCYTES UA: NEGATIVE
NITRITE: NEGATIVE
PH: 6 (ref 5.0–8.0)
Protein, ur: NEGATIVE mg/dL
Specific Gravity, Urine: 1.01 (ref 1.005–1.030)

## 2015-11-29 NOTE — MAU Provider Note (Signed)
DATE: 11/29/2015  Maternity Admissions Unit History and Physical Exam for an Obstetrics Patient  Ms. [name] is a 30 y.o. female, G2P0010, at 357w1d gestation, who presents for evaluation of contractions and pain. [facility].  Her pregnancy has been complicated by anxiety. See history below.  OB History    Gravida Para Term Preterm AB Living   2       1 0   SAB TAB Ectopic Multiple Live Births                  Past Medical History:  Diagnosis Date  . ADHD (attention deficit hyperactivity disorder)   . Anxiety   . Depression   . Headache     Prescriptions Prior to Admission  Medication Sig Dispense Refill Last Dose  . cyclobenzaprine (FLEXERIL) 10 MG tablet Take 1 tablet (10 mg total) by mouth every 8 (eight) hours as needed for muscle spasms. 30 tablet 1   . Prenatal Vit-Fe Fumarate-FA (PRENATAL MULTIVITAMIN) TABS tablet Take 1 tablet by mouth daily at 12 noon.   07/09/2015  . terconazole (TERAZOL 7) 0.4 % vaginal cream Place 1 applicator vaginally at bedtime.   07/10/2015 at Unknown time    Past Surgical History:  Procedure Laterality Date  . NO PAST SURGERIES      No Known Allergies  Family History: family history includes Cancer in her maternal grandfather; Stroke in her maternal grandfather and maternal grandmother.  Social History:  reports that she has quit smoking. She has never used smokeless tobacco. She reports that she does not drink alcohol or use drugs.  Review of systems: Normal pregnancy complaints.  Admission Physical Exam:  Dilation: Fingertip Effacement (%): 20 Station: -3 Exam by:: Dr. Stefano GaulStringer There is no height or weight on file to calculate BMI.  Blood pressure 124/78, pulse 94, temperature 97.9 F (36.6 C), temperature source Oral, resp. rate 18.  HEENT:                 Within normal limits Chest:                   Clear Heart:                     Regular rate and rhythm Abdomen:             Gravid and nontender Extremities:          Grossly normal Neurologic exam: Grossly normal Pelvic exam:         Cervix: ftp/ 20%       NST: Category 1; Contractions: few .   Assessment:  3157w1d gestation  False labor  Plan:  D/C to home RTO 1 week Labor precautions reviewed.   Jyl Chico V 11/29/2015, 12:05 PM

## 2015-11-29 NOTE — MAU Note (Signed)
Pt states she thinks she was having contractions from about 0950 to 1052.  Pt states she does not know how far apart they are.  Pt states it felt like a sharp pain that eased off into a cramp.  Pt states she feels like her belly was getting tight.  Pt denies vaginal bleeding or leaking of fluid.

## 2015-11-29 NOTE — Discharge Instructions (Signed)
Braxton Hicks Contractions °Contractions of the uterus can occur throughout pregnancy. Contractions are not always a sign that you are in labor.  °WHAT ARE BRAXTON HICKS CONTRACTIONS?  °Contractions that occur before labor are called Braxton Hicks contractions, or false labor. Toward the end of pregnancy (32-34 weeks), these contractions can develop more often and may become more forceful. This is not true labor because these contractions do not result in opening (dilatation) and thinning of the cervix. They are sometimes difficult to tell apart from true labor because these contractions can be forceful and people have different pain tolerances. You should not feel embarrassed if you go to the hospital with false labor. Sometimes, the only way to tell if you are in true labor is for your health care provider to look for changes in the cervix. °If there are no prenatal problems or other health problems associated with the pregnancy, it is completely safe to be sent home with false labor and await the onset of true labor. °HOW CAN YOU TELL THE DIFFERENCE BETWEEN TRUE AND FALSE LABOR? °False Labor °· The contractions of false labor are usually shorter and not as hard as those of true labor.   °· The contractions are usually irregular.   °· The contractions are often felt in the front of the lower abdomen and in the groin.   °· The contractions may go away when you walk around or change positions while lying down.   °· The contractions get weaker and are shorter lasting as time goes on.   °· The contractions do not usually become progressively stronger, regular, and closer together as with true labor.   °True Labor °· Contractions in true labor last 30-70 seconds, become very regular, usually become more intense, and increase in frequency.   °· The contractions do not go away with walking.   °· The discomfort is usually felt in the top of the uterus and spreads to the lower abdomen and low back.   °· True labor can be  determined by your health care provider with an exam. This will show that the cervix is dilating and getting thinner.   °WHAT TO REMEMBER °· Keep up with your usual exercises and follow other instructions given by your health care provider.   °· Take medicines as directed by your health care provider.   °· Keep your regular prenatal appointments.   °· Eat and drink lightly if you think you are going into labor.   °· If Braxton Hicks contractions are making you uncomfortable:   °¨ Change your position from lying down or resting to walking, or from walking to resting.   °¨ Sit and rest in a tub of warm water.   °¨ Drink 2-3 glasses of water. Dehydration may cause these contractions.   °¨ Do slow and deep breathing several times an hour.   °WHEN SHOULD I SEEK IMMEDIATE MEDICAL CARE? °Seek immediate medical care if: °· Your contractions become stronger, more regular, and closer together.   °· You have fluid leaking or gushing from your vagina.   °· You have a fever.   °· You pass blood-tinged mucus.   °· You have vaginal bleeding.   °· You have continuous abdominal pain.   °· You have low back pain that you never had before.   °· You feel your baby's head pushing down and causing pelvic pressure.   °· Your baby is not moving as much as it used to.   °  °This information is not intended to replace advice given to you by your health care provider. Make sure you discuss any questions you have with your health care   provider. °  °Document Released: 04/06/2005 Document Revised: 04/11/2013 Document Reviewed: 01/16/2013 °Elsevier Interactive Patient Education ©2016 [employer] ° °

## 2015-12-18 ENCOUNTER — Telehealth (HOSPITAL_COMMUNITY): Payer: Self-pay | Admitting: *Deleted

## 2015-12-18 ENCOUNTER — Encounter (HOSPITAL_COMMUNITY): Payer: Self-pay | Admitting: *Deleted

## 2015-12-18 NOTE — Telephone Encounter (Signed)
Preadmission screen  

## 2015-12-19 ENCOUNTER — Encounter (HOSPITAL_COMMUNITY): Payer: Self-pay | Admitting: *Deleted

## 2015-12-19 ENCOUNTER — Telehealth (HOSPITAL_COMMUNITY): Payer: Self-pay | Admitting: *Deleted

## 2015-12-19 NOTE — Telephone Encounter (Signed)
Preadmission screen  

## 2015-12-25 ENCOUNTER — Other Ambulatory Visit: Payer: Self-pay | Admitting: Obstetrics and Gynecology

## 2015-12-25 DIAGNOSIS — B951 Streptococcus, group B, as the cause of diseases classified elsewhere: Secondary | ICD-10-CM | POA: Insufficient documentation

## 2015-12-25 DIAGNOSIS — F32A Depression, unspecified: Secondary | ICD-10-CM | POA: Insufficient documentation

## 2015-12-25 DIAGNOSIS — F329 Major depressive disorder, single episode, unspecified: Secondary | ICD-10-CM | POA: Insufficient documentation

## 2015-12-25 DIAGNOSIS — O480 Post-term pregnancy: Secondary | ICD-10-CM | POA: Diagnosis present

## 2015-12-25 DIAGNOSIS — F419 Anxiety disorder, unspecified: Secondary | ICD-10-CM | POA: Insufficient documentation

## 2015-12-25 NOTE — H&P (Signed)
[name] is a 30 y.o. female, G1P0 at 7441 weeks, presenting for induction due to post-dates.  Denies HA, visual sx, or epigastric pain.  Denies leaking or bleeding, reports +FM.  Patient Active Problem List   Diagnosis Date Noted  . Post term pregnancy, antepartum condition or complication 12/26/2015  . Anxiety 12/25/2015  . Depression 12/25/2015  . Positive GBS test 12/25/2015  . Post-dates pregnancy 12/25/2015  . Smoker 06/30/2015  . Hx of ovarian cyst 06/30/2015  . History of ADHD 06/30/2015    History of present pregnancy: Patient entered care at 13 2/7 weeks.   EDC of 12/19/15 was established by US at 21 weeks.   Anatomy scan:  21 5/7 weeks, with normal findings and an posterior placenta.   Additional US evaluations:   40 5/7 weeks:  BPP 8/8, AFI 13.47, 55%ile, vtx   Significant prenatal events:  EDC changed at anatomy US, due to uncertain LMP.  Had sciatica during pregnancy.  Had episode of colickly abdominal pain at 13 weeks, seen in ER and MAU, negative evaluation for torsion.  Still smoking small amount.  Elevated glucola, normal 3 hour GTT.  Treated for yeast at 33 4/7 weeks.   Last evaluation:  Seen 12/24/15, no VE, BP 114/68, weight 186.  Last cervical exam at 39 6/7 weeks, 1 cm, 80%, vtx, -2.  OB History    Gravida Para Term Preterm AB Living   2       1 0   SAB TAB Ectopic Multiple Live Births                TAB in past  Past Medical History:  Diagnosis Date  . ADHD (attention deficit hyperactivity disorder)   . Anxiety   . Depression   . Headache   . Hx of varicella   Hx psychiatric admissions as teenager due to depression/suicidal ideations  Past Surgical History:  Procedure Laterality Date  . NO PAST SURGERIES     Family History: family history includes Cancer in her maternal grandfather; Stroke in her maternal grandfather and maternal grandmother. Lupus in [place]; Sister is bipolar; Mother with anxiety; HTN in several family members  Social History:  reports  that she quit smoking about 6 months ago. She has never used smokeless tobacco. She reports that she does not drink alcohol or use drugs.  Patient is Caucasian, divorced, not currently employed.  FOB is not present.  Patient is accompanied by her parents.   Prenatal Transfer Tool  Maternal Diabetes: No Genetic Screening: Declined Maternal Ultrasounds/Referrals: Normal Fetal Ultrasounds or other Referrals:  None Maternal Substance Abuse:  Yes:  Type: Smoker Significant Maternal Medications:  None Significant Maternal Lab Results: Lab values include: Group B Strep positive  TDAP 10/24/15 Flu declined  ROS:  Contractions, +FM  No Known Allergies  BP 130/83, P 58, R16, T 98.5   Chest clear Heart RRR without murmur Abd gravid, NT, FH 39 weeks Pelvic: Anterior cervix, 1 cm, 75%ile, vtx, -2. Ext: WNL  FHR: Category 1 UCs:  q 5 min, mild.  Prenatal labs: ABO, Rh: O/Positive/-- (03/14 0000) Antibody: Negative (03/14 0000) Rubella:  Immune RPR: Nonreactive (03/14 0000)  HBsAg: Negative (03/14 0000)  HIV: Non-reactive (03/14 0000)  GBS:  Positive 11/21/15  Sickle cell/Hgb electrophoresis:  NA Pap:  WNL 06/2015 GC:  Negative 11/21/15 Chlamydia: Negative 11/21/15 Genetic screenings:  Declined Glucola:  Elevated 1 hour at 147, normal 3 hour GTT Other:   Hgb 12.5 at NOB, 12.4 at  28 weeks    Assessment/Plan: IUP at 41 weeks--for induction GBS positive Smoker  Plan: Admit to [facility] per consult with Dr. Estanislado Pandy Routine [facility] orders Pain med/epidural prn--patient does plan to use. PCN G for GBS prophylaxis with onset of labor or initation of pitocin. Risks and benefits of induction were reviewed, including failure of method, prolonged labor, need for further intervention, risk of cesarean.  Patient and family seem to understand these risks and wish to proceed. Options of cytotech, foley bulb, AROM, and pitocin reviewed, with use of each discussed.  Patient prefers to avoid foley  bulb if possible. Will plan pitocin due to cervical and contraction status   Aasha Dina CNM, [place] 12/26/2015, 2:17 AM

## 2015-12-26 ENCOUNTER — Inpatient Hospital Stay (HOSPITAL_COMMUNITY): Payer: Medicaid Other | Admitting: Anesthesiology

## 2015-12-26 ENCOUNTER — Inpatient Hospital Stay (HOSPITAL_COMMUNITY)
Admission: RE | Admit: 2015-12-26 | Discharge: 2015-12-26 | Disposition: A | Discharge status: Home / Self Care | Payer: Medicaid Other | Source: Ambulatory Visit | Attending: Obstetrics & Gynecology | Admitting: Obstetrics & Gynecology

## 2015-12-26 ENCOUNTER — Encounter (HOSPITAL_COMMUNITY): Payer: Self-pay

## 2015-12-26 ENCOUNTER — Inpatient Hospital Stay (HOSPITAL_COMMUNITY)
Admission: RE | Admit: 2015-12-26 | Discharge: 2015-12-29 | DRG: 775 | Disposition: A | Discharge status: Home / Self Care | Payer: Medicaid Other | Source: Ambulatory Visit | Attending: Obstetrics and Gynecology | Admitting: Obstetrics and Gynecology

## 2015-12-26 DIAGNOSIS — O99344 Other mental disorders complicating childbirth: Secondary | ICD-10-CM | POA: Diagnosis present

## 2015-12-26 DIAGNOSIS — O480 Post-term pregnancy: Secondary | ICD-10-CM | POA: Diagnosis present

## 2015-12-26 DIAGNOSIS — F418 Other specified anxiety disorders: Secondary | ICD-10-CM | POA: Diagnosis present

## 2015-12-26 DIAGNOSIS — B951 Streptococcus, group B, as the cause of diseases classified elsewhere: Secondary | ICD-10-CM | POA: Diagnosis present

## 2015-12-26 DIAGNOSIS — Z87891 Personal history of nicotine dependence: Secondary | ICD-10-CM

## 2015-12-26 DIAGNOSIS — O99824 Streptococcus B carrier state complicating childbirth: Secondary | ICD-10-CM | POA: Diagnosis present

## 2015-12-26 DIAGNOSIS — F32A Depression, unspecified: Secondary | ICD-10-CM

## 2015-12-26 DIAGNOSIS — F419 Anxiety disorder, unspecified: Secondary | ICD-10-CM

## 2015-12-26 DIAGNOSIS — F329 Major depressive disorder, single episode, unspecified: Secondary | ICD-10-CM

## 2015-12-26 DIAGNOSIS — Z3A41 41 weeks gestation of pregnancy: Secondary | ICD-10-CM

## 2015-12-26 LAB — ABO/RH: ABO/RH(D): O POS

## 2015-12-26 LAB — CBC
HEMATOCRIT: 34.6 % — AB (ref 36.0–46.0)
HEMOGLOBIN: 12.5 g/dL (ref 12.0–15.0)
MCH: 33.2 pg (ref 26.0–34.0)
MCHC: 36.1 g/dL — AB (ref 30.0–36.0)
MCV: 92 fL (ref 78.0–100.0)
Platelets: 186 10*3/uL (ref 150–400)
RBC: 3.76 MIL/uL — ABNORMAL LOW (ref 3.87–5.11)
RDW: 13.1 % (ref 11.5–15.5)
WBC: 14.7 10*3/uL — ABNORMAL HIGH (ref 4.0–10.5)

## 2015-12-26 LAB — RPR: RPR Ser Ql: NONREACTIVE

## 2015-12-26 LAB — TYPE AND SCREEN
ABO/RH(D): O POS
Antibody Screen: NEGATIVE

## 2015-12-26 MED ORDER — ACETAMINOPHEN 325 MG PO TABS: 650.0000 mg | ORAL_TABLET | ORAL | Status: DC | PRN

## 2015-12-26 MED ORDER — OXYTOCIN BOLUS FROM INFUSION: 500.0000 mL | Freq: Once | INTRAVENOUS | Status: DC

## 2015-12-26 MED ORDER — LIDOCAINE HCL (PF) 1 % IJ SOLN
30.0000 mL | INTRAMUSCULAR | Status: DC | PRN
  Filled 2015-12-26: qty 30

## 2015-12-26 MED ORDER — PHENYLEPHRINE 40 MCG/ML (10ML) SYRINGE FOR IV PUSH (FOR BLOOD PRESSURE SUPPORT)
80.0000 ug | PREFILLED_SYRINGE | INTRAVENOUS | Status: DC | PRN
  Filled 2015-12-26: qty 5

## 2015-12-26 MED ORDER — LACTATED RINGERS IV SOLN
INTRAVENOUS | Status: DC
  Administered 2015-12-26 (×2): via INTRAVENOUS

## 2015-12-26 MED ORDER — FENTANYL 2.5 MCG/ML BUPIVACAINE 1/10 % EPIDURAL INFUSION (WH - ANES)
14.0000 mL/h | INTRAMUSCULAR | Status: DC | PRN
  Administered 2015-12-26 – 2015-12-27 (×3): 14 mL/h via EPIDURAL
  Filled 2015-12-26 (×2): qty 125

## 2015-12-26 MED ORDER — PENICILLIN G POTASSIUM 5000000 UNITS IJ SOLR
5.0000 10*6.[IU] | Freq: Once | INTRAVENOUS | Status: AC
  Administered 2015-12-26: 5 10*6.[IU] via INTRAVENOUS
  Filled 2015-12-26: qty 5

## 2015-12-26 MED ORDER — FENTANYL CITRATE (PF) 100 MCG/2ML IJ SOLN: 50.0000 ug | INTRAMUSCULAR | Status: DC | PRN

## 2015-12-26 MED ORDER — LIDOCAINE HCL (PF) 1 % IJ SOLN
INTRAMUSCULAR | Status: DC | PRN
  Administered 2015-12-26 (×2): 7 mL via EPIDURAL

## 2015-12-26 MED ORDER — LACTATED RINGERS IV SOLN
500.0000 mL | Freq: Once | INTRAVENOUS | Status: AC
  Administered 2015-12-26: 500 mL via INTRAVENOUS

## 2015-12-26 MED ORDER — OXYTOCIN 40 UNITS IN LACTATED RINGERS INFUSION - SIMPLE MED: 1.0000 m[IU]/min | INTRAVENOUS | Status: DC

## 2015-12-26 MED ORDER — FLEET ENEMA 7-19 GM/118ML RE ENEM: 1.0000 | ENEMA | RECTAL | Status: DC | PRN

## 2015-12-26 MED ORDER — DIPHENHYDRAMINE HCL 50 MG/ML IJ SOLN: 12.5000 mg | INTRAMUSCULAR | Status: DC | PRN

## 2015-12-26 MED ORDER — MISOPROSTOL 25 MCG QUARTER TABLET
25.0000 ug | ORAL_TABLET | ORAL | Status: DC | PRN
  Filled 2015-12-26: qty 1
  Filled 2015-12-26: qty 0.25

## 2015-12-26 MED ORDER — ONDANSETRON HCL 4 MG/2ML IJ SOLN: 4.0000 mg | Freq: Four times a day (QID) | INTRAMUSCULAR | Status: DC | PRN

## 2015-12-26 MED ORDER — SOD CITRATE-CITRIC ACID 500-334 MG/5ML PO SOLN: 30.0000 mL | ORAL | Status: DC | PRN

## 2015-12-26 MED ORDER — EPHEDRINE 5 MG/ML INJ
10.0000 mg | INTRAVENOUS | Status: DC | PRN
  Filled 2015-12-26: qty 4

## 2015-12-26 MED ORDER — OXYTOCIN 40 UNITS IN LACTATED RINGERS INFUSION - SIMPLE MED
2.5000 [IU]/h | INTRAVENOUS | Status: DC
  Filled 2015-12-26: qty 1000

## 2015-12-26 MED ORDER — TERBUTALINE SULFATE 1 MG/ML IJ SOLN
0.2500 mg | Freq: Once | INTRAMUSCULAR | Status: DC | PRN
  Filled 2015-12-26: qty 1

## 2015-12-26 MED ORDER — PHENYLEPHRINE 40 MCG/ML (10ML) SYRINGE FOR IV PUSH (FOR BLOOD PRESSURE SUPPORT)
80.0000 ug | PREFILLED_SYRINGE | INTRAVENOUS | Status: DC | PRN
Start: 2015-12-26 — End: 2015-12-27
  Filled 2015-12-26: qty 5
  Filled 2015-12-26: qty 10

## 2015-12-26 MED ORDER — OXYTOCIN 40 UNITS IN LACTATED RINGERS INFUSION - SIMPLE MED
1.0000 m[IU]/min | INTRAVENOUS | Status: DC
  Administered 2015-12-26: 1 m[IU]/min via INTRAVENOUS
  Filled 2015-12-26: qty 1000

## 2015-12-26 MED ORDER — PENICILLIN G POTASSIUM 5000000 UNITS IJ SOLR
2.5000 10*6.[IU] | INTRAVENOUS | Status: DC
  Administered 2015-12-26 – 2015-12-27 (×5): 2.5 10*6.[IU] via INTRAVENOUS
  Filled 2015-12-26 (×8): qty 2.5

## 2015-12-26 MED ORDER — LACTATED RINGERS IV SOLN
500.0000 mL | INTRAVENOUS | Status: DC | PRN
Start: 2015-12-26 — End: 2015-12-27

## 2015-12-26 NOTE — Progress Notes (Signed)
  Subjective: Sleeping at intervals--aware of some UCs, but not uncomfortable.  Objective: BP 106/72   Pulse 61   Temp 98.3 F (36.8 C) (Oral)   Resp 18   Ht 5\' 6"  (1.676 m)   Wt 84.4 kg (186 lb)   BMI 30.02 kg/m  No intake/output data recorded. No intake/output data recorded.  FHT: Category 1 UC:   irregular, every 4-6 minutes SVE:   Dilation: 1 Effacement (%): 70 Station: -1 Exam by:: Emilee HeroLatham CNM   Cervix very anterior, softer Pitocin at 7 mu/min  Assessment:  Induction for post-dates GBS positive Anxiety/depression--stable  Plan: Continue current care.  Nigel BridgemanLATHAM, Sherrick Araki CNM 12/26/2015, 6:28 AM

## 2015-12-26 NOTE — Anesthesia Procedure Notes (Signed)
Epidural Patient location during procedure: OB Start time: 12/26/2015 6:41 PM End time: 12/26/2015 6:45 PM  Staffing Anesthesiologist: Leilani AbleHATCHETT, Ronnel Zuercher Performed: anesthesiologist   Preanesthetic Checklist Completed: patient identified, surgical consent, pre-op evaluation, timeout performed, IV checked, risks and benefits discussed and monitors and equipment checked  Epidural Patient position: sitting Prep: site prepped and draped and DuraPrep Patient monitoring: continuous pulse ox and blood pressure Approach: midline Location: L3-L4 Injection technique: LOR air  Needle:  Needle type: Tuohy  Needle gauge: 17 G Needle length: 9 cm and 9 Needle insertion depth: 5 cm cm Catheter type: closed end flexible Catheter size: 19 Gauge Catheter at skin depth: 10 cm Test dose: negative  Assessment Sensory level: T9 Events: blood not aspirated, injection not painful, no injection resistance, negative IV test and no paresthesia  Additional Notes Reason for block:procedure for pain

## 2015-12-26 NOTE — Anesthesia Pain Management Evaluation Note (Signed)
  CRNA Pain Management Visit Note  Patient: [name], 30 y.o., female  "Hello I am a [facility] the [facility]. We have an anesthesia team available at all times to provide care throughout the hospital, including epidural management and anesthesia for C-section. I don't know your plan for the delivery whether it a natural birth, water birth, IV sedation, nitrous supplementation, doula or epidural, but we want to meet your pain goals."   1.Was your pain managed to your expectations on prior hospitalizations?   No prior hospitalizations  2.What is your expectation for pain management during this hospitalization?     Epidural  3.How can we help you reach that goal? unsure  Record the patient's initial score and the patient's pain goal.   Pain: 2  Pain Goal: 5 [facility] wants you to be able to say your pain was always managed very well.  Cephus ShellingBURGER,[name] 12/26/2015

## 2015-12-26 NOTE — Anesthesia Preprocedure Evaluation (Signed)
Anesthesia Evaluation  Patient identified by MRN, date of birth, ID band Patient awake    Reviewed: Allergy & Precautions, H&P , NPO status , Patient's Chart, lab work & pertinent test results  Airway Mallampati: I   Neck ROM: full    Dental no notable dental hx.    Pulmonary neg pulmonary ROS, former smoker,    Pulmonary exam normal        Cardiovascular negative cardio ROS Normal cardiovascular exam     Neuro/Psych negative psych ROS   GI/Hepatic negative GI ROS, Neg liver ROS,   Endo/Other  negative endocrine ROS  Renal/GU negative Renal ROS     Musculoskeletal   Abdominal Normal abdominal exam  (+)   Peds  Hematology negative hematology ROS (+)   Anesthesia Other Findings   Reproductive/Obstetrics (+) Pregnancy                             Anesthesia Physical Anesthesia Plan  ASA: II  Anesthesia Plan: Epidural   Post-op Pain Management:    Induction:   Airway Management Planned:   Additional Equipment:   Intra-op Plan:   Post-operative Plan:   Informed Consent: I have reviewed the patients History and Physical, chart, labs and discussed the procedure including the risks, benefits and alternatives for the proposed anesthesia with the patient or authorized representative who has indicated his/her understanding and acceptance.     Plan Discussed with:   Anesthesia Plan Comments:         Anesthesia Quick Evaluation

## 2015-12-26 NOTE — Progress Notes (Signed)
Rob Hickmanmily Bakke is a 30 y.o. G2P0010 at 9360w0d admitted for induction of labor due to post dates.  Subjective:   Objective: BP 124/80   Pulse (!) 52   Temp 97.3 F (36.3 C) (Axillary)   Resp 16   Ht 5\' 6"  (1.676 m)   Wt 186 lb (84.4 kg)   BMI 30.02 kg/m  No intake/output data recorded. No intake/output data recorded.  FHT:  FHR: 130s bpm, variability: moderate,  accelerations:  Present,  decelerations:  Absent UC:   regular, every 2-3 minutes SVE:   Dilation: 2 Effacement (%): 50 Station: -1 Exam by:: Dr. Su Hiltoberts Attempted AROM  Labs: Lab Results  Component Value Date   WBC 14.7 (H) 12/26/2015   HGB 12.5 12/26/2015   HCT 34.6 (L) 12/26/2015   MCV 92.0 12/26/2015   PLT 186 12/26/2015    Assessment / Plan: Induction of labor due to post dates,  progressing well on pitocin  Labor: titrate pitocin per protocol.  Attempted AROm just now but no fluid return.  Not certain membranes were truly ruptured. Preeclampsia:  no signs or symptoms of toxicity Fetal Wellbeing:  Category I Pain Control:  upon request I/D:  GBS positive Anticipated MOD:  NSVD  Shylee Durrett Y 12/26/2015, 1:21 PM

## 2015-12-26 NOTE — Progress Notes (Addendum)
Judith Hickmanmily Staib is a 30 y.o. G2P0010 at 4731w0d admitted for Induction secondary to post dates  Subjective: No complaints.  Ctxs 7-8/10.  Denies LOF and no VB.  Objective: BP 130/87   Pulse 71   Temp 98.1 F (36.7 C) (Oral)   Resp 18   Ht 5\' 6"  (1.676 m)   Wt 186 lb (84.4 kg)   BMI 30.02 kg/m  No intake/output data recorded. No intake/output data recorded.  FHT:  FHR: 150 bpm, variability: moderate,  accelerations:  Present,  decelerations:  Absent UC:   regular, every 3 minutes SVE:   Dilation: 2 Effacement (%): 70 Station: -1 Exam by:: Judith CohoAngela Sahana Boyland, MD AROM Clear fluid  Labs: Lab Results  Component Value Date   WBC 14.7 (H) 12/26/2015   HGB 12.5 12/26/2015   HCT 34.6 (L) 12/26/2015   MCV 92.0 12/26/2015   PLT 186 12/26/2015    Assessment / Plan: Induction of labor due to post dates.  Labor: Cont to titrate pitocin per protocol Preeclampsia:  no signs or symptoms of toxicity Fetal Wellbeing:  Category I Pain Control:  upon request I/D:  GBS + Anticipated MOD:  NSVD  [place] Whedbee Y 12/26/2015, 5:47 PM

## 2015-12-27 ENCOUNTER — Encounter (HOSPITAL_COMMUNITY): Payer: Self-pay

## 2015-12-27 MED ORDER — PRENATAL MULTIVITAMIN CH
1.0000 | ORAL_TABLET | Freq: Every day | ORAL | Status: DC
  Administered 2015-12-27 – 2015-12-29 (×3): 1 via ORAL
  Filled 2015-12-27 (×3): qty 1

## 2015-12-27 MED ORDER — BENZOCAINE-MENTHOL 20-0.5 % EX AERO
1.0000 "application " | INHALATION_SPRAY | CUTANEOUS | Status: DC | PRN
  Administered 2015-12-27: 1 via TOPICAL
  Filled 2015-12-27: qty 56

## 2015-12-27 MED ORDER — ZOLPIDEM TARTRATE 5 MG PO TABS: 5.0000 mg | ORAL_TABLET | Freq: Every evening | ORAL | Status: DC | PRN

## 2015-12-27 MED ORDER — COCONUT OIL OIL
1.0000 "application " | TOPICAL_OIL | Status: DC | PRN
  Administered 2015-12-28: 1 via TOPICAL
  Filled 2015-12-27: qty 120

## 2015-12-27 MED ORDER — WITCH HAZEL-GLYCERIN EX PADS: 1.0000 "application " | MEDICATED_PAD | CUTANEOUS | Status: DC | PRN

## 2015-12-27 MED ORDER — ACETAMINOPHEN 325 MG PO TABS
650.0000 mg | ORAL_TABLET | ORAL | Status: DC | PRN
  Administered 2015-12-27: 650 mg via ORAL
  Filled 2015-12-27: qty 2

## 2015-12-27 MED ORDER — SENNOSIDES-DOCUSATE SODIUM 8.6-50 MG PO TABS
2.0000 | ORAL_TABLET | ORAL | Status: DC
  Administered 2015-12-27 – 2015-12-28 (×2): 2 via ORAL
  Filled 2015-12-27 (×2): qty 2

## 2015-12-27 MED ORDER — ONDANSETRON HCL 4 MG PO TABS: 4.0000 mg | ORAL_TABLET | ORAL | Status: DC | PRN

## 2015-12-27 MED ORDER — SIMETHICONE 80 MG PO CHEW: 80.0000 mg | CHEWABLE_TABLET | ORAL | Status: DC | PRN

## 2015-12-27 MED ORDER — ONDANSETRON HCL 4 MG/2ML IJ SOLN
4.0000 mg | INTRAMUSCULAR | Status: DC | PRN
Start: 2015-12-27 — End: 2015-12-29

## 2015-12-27 MED ORDER — DIBUCAINE 1 % RE OINT: 1.0000 "application " | TOPICAL_OINTMENT | RECTAL | Status: DC | PRN

## 2015-12-27 MED ORDER — DIPHENHYDRAMINE HCL 25 MG PO CAPS: 25.0000 mg | ORAL_CAPSULE | Freq: Four times a day (QID) | ORAL | Status: DC | PRN

## 2015-12-27 MED ORDER — TETANUS-DIPHTH-ACELL PERTUSSIS 5-2.5-18.5 LF-MCG/0.5 IM SUSP: 0.5000 mL | Freq: Once | INTRAMUSCULAR | Status: DC

## 2015-12-27 MED ORDER — IBUPROFEN 600 MG PO TABS
600.0000 mg | ORAL_TABLET | Freq: Four times a day (QID) | ORAL | Status: DC
  Administered 2015-12-27 – 2015-12-29 (×9): 600 mg via ORAL
  Filled 2015-12-27 (×10): qty 1

## 2015-12-27 NOTE — Lactation Note (Signed)
This note was copied from a baby's chart. Lactation Consultation Note  Patient Name: Judith Rob Hickmanmily Suchan [id number]'BToday's Date: 12/27/2015 Reason for consult: Initial assessment  Baby 10 hours old. Mom reports that the baby nursed very well after his bath, and reports that she has hand expressed with colostrum present. Mom given [facility] broch[facility] and is aware of OP/BFSG and LC phone line assistance after D/C.  Maternal Data Has patient been taught Hand Expression?: Yes (Per mom, and she has colostrum flowing.) Does the patient have breastfeeding experience prior to this delivery?: No  Feeding Feeding Type: Breast Fed Length of feed: 25 min  LATCH Score/Interventions                      Lactation Tools Discussed/Used     Consult Status Consult Status: Follow-up Date: 12/28/15 Follow-up type: In-patient    Sherlyn HayJennifer D Tamryn Popko 12/27/2015, 2:07 PM

## 2015-12-27 NOTE — Anesthesia Postprocedure Evaluation (Signed)
Anesthesia Post Note  Patient: [name]  Procedure(s) Performed: * No procedures listed *  Patient location during evaluation: Mother Baby Anesthesia Type: Epidural Level of consciousness: awake Pain management: pain level controlled Vital Signs Assessment: post-procedure vital signs reviewed and stable Respiratory status: spontaneous breathing Cardiovascular status: stable Postop Assessment: no headache, no backache, epidural receding and patient able to bend at knees Anesthetic complications: no     Last Vitals:  Vitals:   12/27/15 0645 12/27/15 0740  BP: 126/78 121/67  Pulse: (!) 108 96  Resp: 18 18  Temp: 36.9 C 37.5 C    Last Pain:  Vitals:   12/27/15 0740  TempSrc: Oral  PainSc: 3    Pain Goal: Patients Stated Pain Goal: 1 (12/27/15 0740)               Edison PaceWILKERSON,Gaje Tennyson

## 2015-12-28 NOTE — Progress Notes (Signed)
MOB was referred for history of depression/anxiety.  Referral is screened out by Clinical Social Worker because none of the following criteria appear to apply and there are no reports impacting the pregnancy or her transition to the postpartum period.  CSW does not deem it clinically necessary to further investigate at this time.   -History of anxiety/depression during this pregnancy, or of post-partum depression.  - Diagnosis of anxiety and/or depression within last 3 years.-  - History of depression due to pregnancy loss/loss of child or -MOB's symptoms are currently being treated with medication and/or therapy.  Please contact the Clinical Social Worker if needs arise or upon MOB request.    Rankin Coolman, MSW, LCSW-A Clinical Social Worker  [facility]  Office: [phone]   

## 2015-12-28 NOTE — Discharge Summary (Signed)
[facility]   Patient Name:   [name] DOB:     Oct 21, 1985 MRN:     [id number]  Date of Admission:   12/26/2015 Date of Discharge:  12/29/2015  Admitting diagnosis:    INDUCTION Principal Problem:   Vaginal delivery Active Problems:   Positive GBS test   Post-dates pregnancy      Discharge diagnosis:    INDUCTION Principal Problem:   Vaginal delivery Active Problems:   Positive GBS test   Post-dates pregnancy                                                                  Post partum procedures: NA  Type of Delivery:  SVB  Delivering Provider: Rhea PinkLEMMONS, LORI A   Date of Delivery:  12/27/15  Newborn Data:    Live born female  Birth Weight: 8 lb 10.1 oz (3915 g) APGAR: 9, 10  Baby's Name:    Baby Feeding:   Breast Disposition:   rooming in due to jaundice at the time of mother's d/c.  Complications:   None  Hospital course:      Induction of Labor With Vaginal Delivery   30 y.o. yo G2P1011 at 2172w1d was admitted to the hospital 12/26/2015 for induction of labor.  Indication for induction: Postdates.  Patient had an uncomplicated labor course as follows: Membrane Rupture Time/Date: 5:38 PM ,12/26/2015   Intrapartum Procedures: Episiotomy: None [1]                                         Lacerations:  2nd degree [3];Perineal [11]  Patient had delivery of a Viable infant.  Information for the patient's newborn:  Roel CluckMeeks, Boy Mililani [[id number]][[id number]]  Delivery Method: Vaginal, Spontaneous Delivery (Filed from Delivery Summary)   12/27/2015  Details of delivery can be found in separate delivery note.  Patient had a routine postpartum course. Patient is discharged home 12/29/15.   Physical Exam:   Vitals:   12/27/15 1740 12/27/15 2042 12/28/15 0646 12/28/15 1848  BP: 117/71 (!) 118/59 120/72 125/64  Pulse: 91 81 67 68  Resp: 18 19 18 18   Temp: 98.5 F (36.9 C) 98.6 F (37 C) 98.2 F (36.8 C) 97.9 F (36.6 C)  TempSrc: Oral Oral  Oral   SpO2:      Weight:      Height:       General: alert Lochia: appropriate Uterine Fundus: firm Incision: Healing well with no significant drainage DVT Evaluation: No evidence of DVT seen on physical exam. Negative Homan's sign.  Labs:  CBC Latest Ref Rng & Units 12/26/2015 06/29/2015 06/29/2015  WBC 4.0 - 10.5 K/uL 14.7(H) - 14.4(H)  Hemoglobin 12.0 - 15.0 g/dL 78.212.5 11.9(L) 12.3  Hematocrit 36.0 - 46.0 % 34.6(L) 35.0(L) 34.6(L)  Platelets 150 - 400 K/uL 186 - 241     Discharge instruction: per After Visit Summary and "Baby and Me Booklet".  After Visit Meds:    Medication List    TAKE these medications   calcium carbonate 500 MG chewable tablet Commonly known as:  TUMS - dosed in mg elemental calcium Chew 1-2 tablets by mouth 3 (  three) times daily as needed for indigestion or heartburn.   Fish Oil 500 MG Caps Take 1 capsule by mouth daily.   ibuprofen 600 MG tablet Commonly known as:  ADVIL,MOTRIN Take 1 tablet (600 mg total) by mouth every 6 (six) hours as needed.   polycarbophil 625 MG tablet Commonly known as:  FIBERCON Take 1,875 mg by mouth as needed for mild constipation.   prenatal multivitamin Tabs tablet Take 1 tablet by mouth daily at 12 noon.       Diet: routine diet  Activity: Advance as tolerated. Pelvic rest for 6 weeks.   Outpatient follow up:6 weeks Follow up Appt:No future appointments. Follow up visit: No Follow-up on file.  Postpartum contraception: Undecided  12/29/2015 Nigel Bridgeman, CNM

## 2015-12-28 NOTE — Discharge Instructions (Signed)

## 2015-12-28 NOTE — Progress Notes (Signed)
Subjective: Postpartum Day 1: Vaginal delivery, 2nd degree laceration Patient up ad lib, reports no syncope or dizziness. Feeding:  Breast Contraceptive plan:  Undecided  Objective: Vital signs in last 24 hours: Temp:  [97.6 F (36.4 C)-99.5 F (37.5 C)] 98.6 F (37 C) (09/08 2042) Pulse Rate:  [56-108] 81 (09/08 2042) Resp:  [16-19] 19 (09/08 2042) BP: (117-136)/(59-91) 118/59 (09/08 2042)  Physical Exam:  General: alert and cooperative Lochia: appropriate Uterine Fundus: firm Perineum: healing well DVT Evaluation: No evidence of DVT seen on physical exam. Negative Homan's sign.   CBC Latest Ref Rng & Units 12/26/2015 06/29/2015 06/29/2015  WBC 4.0 - 10.5 K/uL 14.7(H) - 14.4(H)  Hemoglobin 12.0 - 15.0 g/dL 16.112.5 11.9(L) 12.3  Hematocrit 36.0 - 46.0 % 34.6(L) 35.0(L) 34.6(L)  Platelets 150 - 400 K/uL 186 - 241     Assessment/Plan: Status post vaginal delivery day 1. Stable Continue current care. Day 1 CBC pending Plan for discharge tomorrow    [name], [name] CNM 12/28/2015, 5:31 AM

## 2015-12-29 MED ORDER — IBUPROFEN 600 MG PO TABS: 600.0000 mg | ORAL_TABLET | Freq: Four times a day (QID) | ORAL | 2 refills | Status: DC | PRN

## 2015-12-29 NOTE — Lactation Note (Signed)
This note was copied from a baby's chart. Lactation Consultation Note  Patient Name: Boy Rob Hickmanmily Buttacavoli ZOXWR'UToday's Date: 12/29/2015 Reason for consult: Follow-up assessment;Hyperbilirubinemia Infant is 8354 hours old & [facility]. Baby is on triple phototherapy. Baby has BF 9 times in the past 24 hours (ranging from 15-120 mins- mostly 15-20 mins each time). Baby was under the phototherapy lights sleeping when LC entered; last feeding was @ 0900 for 15 mins. Mom reports BF is going well but that she has some nipple soreness and that she has been using the coconut oil & reports it is helping some. Suggested mom could also use EBM on her nipples after BF to help with any soreness. Mom reports she plans to get a pump from her insurance. Mom was very sleepy when LC was in room. Encouraged mom to ask for LC at a future feed to assess latch.  Maternal Data    Feeding Feeding Type: Breast Fed Length of feed: 15 min  LATCH Score/Interventions                      Lactation Tools Discussed/Used     Consult Status Consult Status: Follow-up Date: 12/30/15 Follow-up type: In-patient    Oneal GroutLaura C Jahnyla Parrillo 12/29/2015, 10:27 AM

## 2015-12-30 ENCOUNTER — Ambulatory Visit: Payer: Self-pay

## 2015-12-30 NOTE — Lactation Note (Addendum)
This note was copied from a baby's chart. Lactation Consultation Note  Patient Name: [name] ZOXWR'UToday's Date: 12/30/2015 Reason for consult: Follow-up assessment   With this mom of a [post term baby, now 1582 hours old. H has double phototherapy, down from triple. He has had more that adequate wet and dirty diapers, and is at 8% weigh loss. He has been feeding every 2-3 hours. Mom has long , round nipples with scabs on the end of both. Mom says she has trouble latching him deeply. On brief oral exam, the baby has an upper lip frenulum that is thick and extends to the gum line, and I was not able to adequately assess his tongue, since I did not want to wake the baby. The baby may be discharged to home today, if his bili is in acceptable range (drawn around 2 pm). I  asked mom to call for lactation to observe next latch, so we could try and figure out why her nipples are sore. Mom may need an o/p lactation appointment made prior to her discharge to home .   Maternal Data    Feeding    LATCH Score/Interventions             Problem noted: Severe discomfort;Cracked, bleeding, blisters, bruises Interventions  (Cracked/bleeding/bruising/blister): Expressed breast milk to nipple Interventions (Mild/moderate discomfort): Comfort gels        Lactation Tools Discussed/Used     Consult Status Consult Status: Follow-up Date: 12/30/15 Follow-up type: In-patient    [name], [name] 12/30/2015, 2:22 PM

## 2015-12-30 NOTE — Lactation Note (Signed)
This note was copied from a baby's chart. Lactation Consultation Note Mom's breast filling after BF. Baby is cluster feeding. Baby on triple photo therapy. Mom shown how to use DEBP & how to disassemble, clean, & reassemble parts. Encouraged mom to post pump if filling. Mom pumped 12 ml. LC gave to baby via syring and gloved finger. Baby had large void and a large stool. Mom's Lt. Nipple has break down, positional stripes cracks. Comfort gels given. Mom had coconut oil. Encouraged to apply before pumping to lubercate nipples. Instructed to to have coconut oil on nipples and wear CG. Educated on filling, engorgement, and pumping.  Patient Name: [name] AVWUJ'WToday's Date: 12/30/2015 Reason for consult: Follow-up assessment;Breast/nipple pain;Hyperbilirubinemia;Infant weight loss   Maternal Data    Feeding Feeding Type: Breast Milk Length of feed: 30 min  LATCH Score/Interventions Latch: Grasps breast easily, tongue down, lips flanged, rhythmical sucking. Intervention(s): Breast compression;Breast massage  Audible Swallowing: Spontaneous and intermittent  Type of Nipple: Everted at rest and after stimulation  Comfort (Breast/Nipple): Filling, red/small blisters or bruises, mild/mod discomfort  Problem noted: Filling;Cracked, bleeding, blisters, bruises;Mild/Moderate discomfort Interventions (Filling): Massage;Frequent nursing;Double electric pump Interventions  (Cracked/bleeding/bruising/blister): Expressed breast milk to nipple Interventions (Mild/moderate discomfort): Comfort gels;Post-pump;Hand massage;Hand expression  Hold (Positioning): No assistance needed to correctly position infant at breast. Intervention(s): Support Pillows;Position options;Skin to skin;Breastfeeding basics reviewed  LATCH Score: 9  Lactation Tools Discussed/Used Tools: Pump Breast pump type: Double-Electric Breast Pump Pump Review: Setup, frequency, and cleaning;Milk Storage Initiated by:: Peri JeffersonL. Sabrie Moritz  RN IBCLC Date initiated:: 12/30/15   Consult Status Consult Status: PRN Date: 12/31/15 Follow-up type: In-patient    Charyl DancerCARVER, Raysha Tilmon G 12/30/2015, 5:16 AM

## 2017-07-15 DIAGNOSIS — F902 Attention-deficit hyperactivity disorder, combined type: Secondary | ICD-10-CM | POA: Insufficient documentation

## 2019-02-06 ENCOUNTER — Other Ambulatory Visit: Payer: Self-pay

## 2019-02-06 ENCOUNTER — Ambulatory Visit (INDEPENDENT_AMBULATORY_CARE_PROVIDER_SITE_OTHER): Payer: 59 | Admitting: Psychiatry

## 2019-02-06 ENCOUNTER — Encounter: Payer: Self-pay | Admitting: Psychiatry

## 2019-02-06 DIAGNOSIS — F4310 Post-traumatic stress disorder, unspecified: Secondary | ICD-10-CM
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
## 2019-02-27 ENCOUNTER — Ambulatory Visit (INDEPENDENT_AMBULATORY_CARE_PROVIDER_SITE_OTHER): Payer: 59 | Admitting: Psychiatry

## 2019-02-27 ENCOUNTER — Other Ambulatory Visit: Payer: Self-pay

## 2019-02-27 DIAGNOSIS — F4310 Post-traumatic stress disorder, unspecified: Secondary | ICD-10-CM
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
## 2019-04-17 ENCOUNTER — Other Ambulatory Visit: Payer: Self-pay

## 2019-04-17 ENCOUNTER — Emergency Department
Admission: EM | Admit: 2019-04-17 | Discharge: 2019-04-17 | Disposition: A | Discharge status: Home / Self Care | Payer: BLUE CROSS/BLUE SHIELD | Source: Home / Self Care | Attending: Family Medicine | Admitting: Family Medicine

## 2019-04-17 DIAGNOSIS — M542 Cervicalgia: Secondary | ICD-10-CM

## 2019-04-17 MED ORDER — PREDNISONE 20 MG PO TABS: ORAL_TABLET | ORAL | 0 refills | Status: DC

## 2019-04-17 MED ORDER — HYDROCODONE-ACETAMINOPHEN 5-325 MG PO TABS: ORAL_TABLET | ORAL | 0 refills | Status: DC

## 2019-04-17 NOTE — ED Triage Notes (Signed)
Pt c/o neck pain and LT shoulder pain that started last Tues. Starts at base of neck between shoulder blades and radiates to LT side (shoulder) Not sleeping well. Wakes her up at night. Pain 5/10. Has tried epsom salt baths and heat/ice therapy. Advil and tylenol prn.

## 2019-04-17 NOTE — ED Provider Notes (Signed)
[name]    CSN: [id number] Arrival date & time: 04/17/19  1209      History   Chief Complaint Chief Complaint  Patient presents with  . Neck Pain    HPI [name] is a 33 y.o. female.   Patient complains of six day history of left neck pain that radiates to her left shoulder and left upper chest.  The pain awakens her at night and has not improved with epsom salt baths and heat/ice therapy.  She recalls no injury.  She has made an appointment with neurologist, but decided to be seen today because of increasing pain.  The history is provided by the patient.    Past Medical History:  Diagnosis Date  . ADHD (attention deficit hyperactivity disorder)   . Anxiety   . Depression   . Headache   . Hx of varicella     Patient Active Problem List   Diagnosis Date Noted  . Vaginal delivery 12/28/2015  . Anxiety 12/25/2015  . Depression 12/25/2015  . Positive GBS test 12/25/2015  . Post-dates pregnancy 12/25/2015  . Smoker 06/30/2015  . Hx of ovarian cyst 06/30/2015  . History of ADHD 06/30/2015    Past Surgical History:  Procedure Laterality Date  . NO PAST SURGERIES      OB History    Gravida  2   Para  1   Term  1   Preterm      AB  1   Living  1     SAB      TAB      Ectopic      Multiple  0   Live Births  1            Home Medications    Prior to Admission medications   Medication Sig Start Date End Date Taking? Authorizing Provider  amphetamine-dextroamphetamine (ADDERALL XR) 10 MG 24 hr capsule Take 20 mg by mouth daily.   Yes [provider]  calcium carbonate (TUMS - DOSED IN MG ELEMENTAL CALCIUM) 500 MG chewable tablet Chew 1-2 tablets by mouth 3 (three) times daily as needed for indigestion or heartburn.    [provider]  HYDROcodone-acetaminophen (NORCO/VICODIN) 5-325 MG tablet Take one by mouth at bedtime as needed for pain 04/17/19   Lattie Haw, MD  ibuprofen (ADVIL,MOTRIN) 600 MG  tablet Take 1 tablet (600 mg total) by mouth every 6 (six) hours as needed. 12/29/15   Nigel Bridgeman, CNM  Omega-3 Fatty Acids (FISH OIL) 500 MG CAPS Take 1 capsule by mouth daily.    [provider]  polycarbophil (FIBERCON) 625 MG tablet Take 1,875 mg by mouth as needed for mild constipation.    [provider]  predniSONE (DELTASONE) 20 MG tablet Take one tab by mouth twice daily for 4 days, then one daily. Take with food. 04/17/19   Lattie Haw, MD  Prenatal Vit-Fe Fumarate-FA (PRENATAL MULTIVITAMIN) TABS tablet Take 1 tablet by mouth daily at 12 noon.    [provider]    Family History Family History  Problem Relation Age of Onset  . Stroke Maternal Grandmother   . Anxiety disorder Maternal Grandmother   . Cancer Maternal Grandfather   . Stroke Maternal Grandfather   . ADD / ADHD Mother   . Alcoholism Father     Social History Social History   Tobacco Use  . Smoking status: Former Smoker    Quit date: 06/25/2015    Years since  quitting: 3.8  . Smokeless tobacco: Never Used  Substance Use Topics  . Alcohol use: No  . Drug use: No     Allergies   Patient has no known allergies.   Review of Systems Review of Systems  Constitutional: Negative.   HENT: Negative.   Eyes: Negative.   Respiratory: Negative.   Cardiovascular: Negative.   Gastrointestinal: Negative.   Genitourinary: Negative.   Musculoskeletal: Positive for neck pain and neck stiffness.  Skin: Negative.   Neurological: Negative for numbness and headaches.     Physical Exam Triage Vital Signs ED Triage Vitals [04/17/19 1235]  Enc Vitals Group     BP 121/83     Pulse Rate 87     Resp 18     Temp 98.2 F (36.8 C)     Temp Source Oral     SpO2 100 %     Weight 169 lb (76.7 kg)     Height 5\' 7"  (1.702 m)     Head Circumference      Peak Flow      Pain Score 5     Pain Loc      Pain Edu?      Excl. in [place]?    No data found.  Updated Vital Signs BP 121/83 (BP  Location: Right Arm)   Pulse 87   Temp 98.2 F (36.8 C) (Oral)   Resp 18   Ht 5\' 7"  (1.702 m)   Wt 76.7 kg   LMP 03/21/2019 (Approximate)   SpO2 100%   BMI 26.47 kg/m   Visual Acuity Right Eye Distance:   Left Eye Distance:   Bilateral Distance:    Right Eye Near:   Left Eye Near:    Bilateral Near:     Physical Exam Vitals and nursing note reviewed.  Constitutional:      General: She is not in acute distress. HENT:     Head: Normocephalic.     Right Ear: External ear normal.     Left Ear: External ear normal.     Nose: Nose normal.     Mouth/Throat:     Pharynx: Oropharynx is clear.  Eyes:     Conjunctiva/sclera: Conjunctivae normal.     Pupils: Pupils are equal, round, and reactive to light.  Neck:      Comments: Tenderness posterior neck  as noted on diagram.  Cardiovascular:     Heart sounds: Normal heart sounds.  Pulmonary:     Breath sounds: Normal breath sounds.  Musculoskeletal:     Cervical back: Pain with movement present. Decreased range of motion.  Lymphadenopathy:     Cervical: No cervical adenopathy.  Skin:    General: Skin is warm and dry.     Findings: No rash.  Neurological:     General: No focal deficit present.     Mental Status: She is alert.      UC Treatments / Results  Labs (all labs ordered are listed, but only abnormal results are displayed) Labs Reviewed - No data to display  EKG   Radiology No results found.  Procedures Procedures (including critical care time)  Medications Ordered in UC Medications - No data to display  Initial Impression / Assessment and Plan / UC Course  I have reviewed the triage vital signs and the nursing notes.  Pertinent labs & imaging results that were available during my care of the patient were reviewed by me and considered in my medical decision making (see chart  for details).    Suspect cervical radiculopathy.  Will defer imaging because patient has upcoming appointment with  neurologist.  Begin prednisone burst/taper. Rx for Lortab at bedtime (#5, no refill). Controlled Substance Prescriptions I have consulted the [place] Controlled Substances Registry for this patient, and feel the risk/benefit ratio today is favorable for proceeding with this prescription for a controlled substance.   Followup with neurologist as scheduled.   Final Clinical Impressions(s) / UC Diagnoses   Final diagnoses:  Neck pain     Discharge Instructions     Apply ice pack for 20 to 30 minutes, 3 to 4 times daily  Continue until pain and swelling decrease.     ED Prescriptions    Medication Sig Dispense Auth. Provider   predniSONE (DELTASONE) 20 MG tablet Take one tab by mouth twice daily for 4 days, then one daily. Take with food. 12 tablet Lattie HawBeese, Lacoya Wilbanks A, MD   HYDROcodone-acetaminophen (NORCO/VICODIN) 5-325 MG tablet Take one by mouth at bedtime as needed for pain 5 tablet Lattie HawBeese, Porter Nakama A, MD        Lattie HawBeese, Sincere Berlanga A, MD 04/21/19 [phone]

## 2019-04-17 NOTE — Discharge Instructions (Signed)
Apply ice pack for 20 to 30 minutes, 3 to 4 times daily  Continue until pain and swelling decrease.  °

## 2019-04-19 ENCOUNTER — Ambulatory Visit: Payer: BLUE CROSS/BLUE SHIELD | Admitting: Psychiatry

## 2019-04-19 ENCOUNTER — Other Ambulatory Visit: Payer: Self-pay

## 2019-04-19 DIAGNOSIS — F4310 Post-traumatic stress disorder, unspecified: Secondary | ICD-10-CM | POA: Diagnosis not present
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
## 2019-05-05 ENCOUNTER — Ambulatory Visit (INDEPENDENT_AMBULATORY_CARE_PROVIDER_SITE_OTHER): Payer: BLUE CROSS/BLUE SHIELD | Admitting: Psychiatry

## 2019-05-05 DIAGNOSIS — F4310 Post-traumatic stress disorder, unspecified: Secondary | ICD-10-CM | POA: Diagnosis not present
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
## 2019-05-15 ENCOUNTER — Telehealth: Payer: Self-pay | Admitting: Psychiatry

## 2019-05-15 NOTE — Telephone Encounter (Signed)
Called patient talked with her about getting her in for an earlier appointment due to patient expressing an increase in flashbacks.  She was worked in for a lunch appointment.

## 2019-05-18 ENCOUNTER — Ambulatory Visit (INDEPENDENT_AMBULATORY_CARE_PROVIDER_SITE_OTHER): Payer: BLUE CROSS/BLUE SHIELD | Admitting: Psychiatry

## 2019-05-18 DIAGNOSIS — F4310 Post-traumatic stress disorder, unspecified: Secondary | ICD-10-CM | POA: Diagnosis not present
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
## 2019-05-30 ENCOUNTER — Ambulatory Visit (INDEPENDENT_AMBULATORY_CARE_PROVIDER_SITE_OTHER): Payer: Self-pay | Admitting: Psychiatry

## 2019-05-30 DIAGNOSIS — F4310 Post-traumatic stress disorder, unspecified: Secondary | ICD-10-CM
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
## 2019-06-09 ENCOUNTER — Ambulatory Visit (INDEPENDENT_AMBULATORY_CARE_PROVIDER_SITE_OTHER): Payer: BLUE CROSS/BLUE SHIELD | Admitting: Psychiatry

## 2019-06-09 DIAGNOSIS — F4310 Post-traumatic stress disorder, unspecified: Secondary | ICD-10-CM
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
## 2019-06-20 ENCOUNTER — Ambulatory Visit (INDEPENDENT_AMBULATORY_CARE_PROVIDER_SITE_OTHER): Payer: BLUE CROSS/BLUE SHIELD | Admitting: Psychiatry

## 2019-06-20 DIAGNOSIS — F4310 Post-traumatic stress disorder, unspecified: Secondary | ICD-10-CM

## 2019-06-20 NOTE — Progress Notes (Signed)
[facility] Counselor/Therapist Progress Note  Patient ID: [name], MRN: [id number],    Date: 06/20/2019  Time Spent: 52 minutes start 12:06 PM time end time 12:58 PM Virtual Visit via Telephone Note Connected with patient by a video enabled telemedicine/telehealth application, with their informed consent, and verified patient privacy and that I am speaking with the correct person using two identifiers. I discussed the limitations, risks, security and privacy concerns of performing psychotherapy and management service by telephone and the availability of in person appointments. I also discussed with the patient that there may be a patient responsible charge related to this service. The patient expressed understanding and agreed to proceed. I discussed the treatment planning with the patient. The patient was provided an opportunity to ask questions and all were answered. The patient agreed with the plan and demonstrated an understanding of the instructions. The patient was advised to call  our office if  symptoms worsen or feel they are in a crisis state and need immediate contact.   Therapist Location: office Patient Location: home   Treatment Type: Individual Therapy  Reported Symptoms: anxiety, triggered responses,panic, crying spells, meltdowns  Mental Status Exam:  Appearance:   Well Groomed     Behavior:  Appropriate  Motor:  Normal  Speech/Language:   Normal Rate  Affect:  Appropriate and Tearful  Mood:  anxious and sad  Thought process:  normal  Thought content:    Rumination  Sensory/Perceptual disturbances:    Flashback  Orientation:  oriented to person, place, time/date and situation  Attention:  Good  Concentration:  Good  Memory:  WNL  Fund of knowledge:   Good  Insight:    Good  Judgment:   Good  Impulse Control:  Good   Risk Assessment: Danger to Self:  No Self-injurious Behavior: No Danger to Others: No Duty to Warn:no Physical Aggression /  Violence:No  Access to Firearms a concern: No  Gang Involvement:No   Subjective: Met with patient via virtual session through [employer].  She shared she had called for the emergency session due to another conflict with her parents.  She explained her mother and she had a discussion because when her friend is around she treats patient very differently and ignored her in that moment.  She also shared that she is recognizing that her son is repeating things that her parents are saying to her when they might occur which is very disturbing.  Patient did EMDR set on her mom not caring, suds level 10, negative cognition "I do not matter" felt anger and pain in her arms.  Patient was able to reduce suds level to 5.  She was able to recognize that this is been a huge pattern with her mother her whole life and that it has been very painful for her to feel that she does not matter to her mom.  Other memories from the past surfaced during the set that were also traumatic to her concerning her mother.  Patient was encouraged to take time away from her parents and get herself back to being able to have perspective when she interacts with them.  She was encouraged to remind herself that they are who they are and they are not going to change but that does not mean that she is not enough.  Patient was also encouraged to communicate with her sister who has disconnected from her parents in the past.  She was also encouraged to continue running to get all of  the emotions out of her appropriately.  Patient also discussed having to take her son to the dentist and how difficult that was she was encouraged to recognize that those things can be very difficult but it seems she handled it appropriately.  Encouraged patient to continue working on affirmations and self-care.  Interventions: Solution-Oriented/Positive Psychology and Eye Movement Desensitization and Reprocessing (EMDR)  Diagnosis:   ICD-10-CM   1. PTSD (post-traumatic  stress disorder)  F43.10     Plan: Patient is to use CBT and coping skills to decrease triggered responses.  Patient is to take some time away from her parents to let her motions decrease.  Patient is to communicate concerns and memories with her sister.  Patient is to continue running to release negative emotions in an appropriate manner. Long-term goal: Recall the traumatic event without becoming overwhelmed with negative emotions Short-term goal: Practice implement relaxation training as a coping mechanism for tension panic stress anger and anxiety  [name], [facility]

## 2019-06-27 ENCOUNTER — Ambulatory Visit: Payer: BLUE CROSS/BLUE SHIELD | Admitting: Psychiatry

## 2019-07-26 DIAGNOSIS — F4323 Adjustment disorder with mixed anxiety and depressed mood: Secondary | ICD-10-CM | POA: Insufficient documentation

## 2019-08-23 DIAGNOSIS — R4689 Other symptoms and signs involving appearance and behavior: Secondary | ICD-10-CM | POA: Insufficient documentation

## 2019-08-23 DIAGNOSIS — F4310 Post-traumatic stress disorder, unspecified: Secondary | ICD-10-CM | POA: Insufficient documentation

## 2019-08-23 DIAGNOSIS — Z046 Encounter for general psychiatric examination, requested by authority: Secondary | ICD-10-CM | POA: Insufficient documentation

## 2019-08-31 ENCOUNTER — Ambulatory Visit (INDEPENDENT_AMBULATORY_CARE_PROVIDER_SITE_OTHER): Payer: 59 | Admitting: Psychiatry

## 2019-08-31 ENCOUNTER — Other Ambulatory Visit: Payer: Self-pay

## 2019-08-31 DIAGNOSIS — F4310 Post-traumatic stress disorder, unspecified: Secondary | ICD-10-CM
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
## 2019-09-11 ENCOUNTER — Ambulatory Visit (INDEPENDENT_AMBULATORY_CARE_PROVIDER_SITE_OTHER): Payer: 59 | Admitting: Psychiatry

## 2019-09-11 ENCOUNTER — Other Ambulatory Visit: Payer: Self-pay

## 2019-09-11 DIAGNOSIS — F4310 Post-traumatic stress disorder, unspecified: Secondary | ICD-10-CM | POA: Diagnosis not present

## 2019-09-11 NOTE — Progress Notes (Signed)
[facility] Counselor/Therapist Progress Note  Patient ID: [name], MRN: [id number],    Date: 09/11/2019  Time Spent: 51 minutes start time 10:09 AM end time 11 AM  Treatment Type: Individual Therapy  Reported Symptoms: anxiety, triggered responses, sadness, anger  Mental Status Exam:  Appearance:   Well Groomed     Behavior:  Appropriate  Motor:  Normal  Speech/Language:   Normal Rate  Affect:  Appropriate  Mood:  normal  Thought process:  normal  Thought content:    WNL  Sensory/Perceptual disturbances:    WNL  Orientation:  oriented to person, place, time/date and situation  Attention:  Good  Concentration:  Good  Memory:  WNL  Fund of knowledge:   Good  Insight:    Good  Judgment:   Good  Impulse Control:  Good   Risk Assessment: Danger to Self:  No Self-injurious Behavior: No Danger to Others: No Duty to Warn:no Physical Aggression / Violence:No  Access to Firearms a concern: No  Gang Involvement:No   Subjective: Patient was present for session. She reported that she is realizing things are not going to change with her family.  She also has realized that she has to act a certain way for her family to accept her.  Patient shared more of the things that have transpired since last session with her family.  She shared her sister had sent her a Mother's Day gift but she sent it back because nothing had been resolved since she was released on the IVC her sister had placed on her.  Patient was encouraged to recognize that somewhere she had to have known that was good to trigger her sister and she would get angry with her again.  Patient shared that she is realizing that whenever she sets a limit or says no that it is an issue and that she has a choice to either block them and stay disconnected or to know she has to act a certain way if there is not can be drama within the family.  Patient shared that the same issue has also happened with her mother that she was doing  everything like they wanted her to but for some reason mother blocked her and would not respond to anything she was texting.  She shared that her mother's husband finally texted that they were going to go on vacation.  She shared it was frustrating but then it was affirming that she just needs to stay disconnected and not try and engage with him.  She went on to share that she has done that for the past few weeks and has felt much more peace and calm in her life when she does not have the drama.  During the last week though she got a visit from CPS who reported that her mother and sister had called due to concerns for the welfare of her son.  Explained she allowed the CPS worker to come in and interact with her son and look at her house and the worker was able to identify that there were no safety issues.  Explained to the worker that she had been walking her family because of the IVC and currently she was interviewing for a new position and she did not want to have the drama that occurs with them in her head and keep her from getting a future job.  She shared that the CPS worker understood her reasoning and encouraged to her to do that anytime she  felt that they were triggering to her.  Patient stated now multiple people have told her to block her family so she is going to work on her self-care and not have the expectation that things are going to change with her mother.  Patient stated that was a hard realization but unnecessary 1.  Patient was encouraged to continue focusing on her self-care and taking care of her son at this time.  Interventions: Solution-Oriented/Positive Psychology  Diagnosis:   ICD-10-CM   1. PTSD (post-traumatic stress disorder)  F43.10     Plan: Patient is to use CBT and coping skills to manage triggered responses appropriately.  Patient is to continue staying disconnected from her family until she feels ready to deal with what ever drama will go with them.  Was also encouraged to  focus on her self-care and caring for her son at this time. Long-term goal: Recall the traumatic event without becoming overwhelmed with negative emotions Short-term goal: Practice implement relaxation training as a coping mechanism for tension panic stress anger and anxiety  [name], [facility]

## 2019-09-25 ENCOUNTER — Ambulatory Visit (INDEPENDENT_AMBULATORY_CARE_PROVIDER_SITE_OTHER): Payer: 59 | Admitting: Psychiatry

## 2019-09-25 ENCOUNTER — Other Ambulatory Visit: Payer: Self-pay

## 2019-09-25 DIAGNOSIS — F4310 Post-traumatic stress disorder, unspecified: Secondary | ICD-10-CM | POA: Diagnosis not present
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
## 2019-10-26 ENCOUNTER — Ambulatory Visit: Payer: 59 | Admitting: Adult Health

## 2020-01-08 ENCOUNTER — Telehealth: Payer: Self-pay | Admitting: Psychiatry

## 2020-01-08 ENCOUNTER — Ambulatory Visit (INDEPENDENT_AMBULATORY_CARE_PROVIDER_SITE_OTHER): Payer: 59 | Admitting: Psychiatry

## 2020-01-08 DIAGNOSIS — F4310 Post-traumatic stress disorder, unspecified: Secondary | ICD-10-CM | POA: Diagnosis not present

## 2020-01-08 NOTE — Telephone Encounter (Signed)
[name], witkop are scheduled for a virtual visit with your provider today.    Just as we do with appointments in the office, we must obtain your consent to participate.  Your consent will be active for this visit and any virtual visit you may have with one of our providers in the next 365 days.    If you have a MyChart account, I can also send a copy of this consent to you electronically.  All virtual visits are billed to your insurance company just like a traditional visit in the office.  As this is a virtual visit, video technology does not allow for your provider to perform a traditional examination.  This may limit your provider's ability to fully assess your condition.  If your provider identifies any concerns that need to be evaluated in person or the need to arrange testing such as labs, EKG, etc, we will make arrangements to do so.    Although advances in technology are sophisticated, we cannot ensure that it will always work on either your end or our end.  If the connection with a video visit is poor, we may have to switch to a telephone visit.  With either a video or telephone visit, we are not always able to ensure that we have a secure connection.   I need to obtain your verbal consent now.   Are you willing to proceed with your visit today?   [name] has provided verbal consent on 01/08/2020 for a virtual visit (video or telephone).   Stevphen Meuse, [facility] 01/08/2020  10:05 AM
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
## 2020-01-10 ENCOUNTER — Encounter: Payer: Self-pay | Admitting: Adult Health

## 2020-01-10 ENCOUNTER — Telehealth (INDEPENDENT_AMBULATORY_CARE_PROVIDER_SITE_OTHER): Payer: 59 | Admitting: Adult Health

## 2020-01-10 DIAGNOSIS — F909 Attention-deficit hyperactivity disorder, unspecified type: Secondary | ICD-10-CM | POA: Diagnosis not present

## 2020-01-10 DIAGNOSIS — F4310 Post-traumatic stress disorder, unspecified: Secondary | ICD-10-CM | POA: Diagnosis not present

## 2020-01-10 DIAGNOSIS — F411 Generalized anxiety disorder: Secondary | ICD-10-CM | POA: Diagnosis not present

## 2020-01-10 DIAGNOSIS — F331 Major depressive disorder, recurrent, moderate: Secondary | ICD-10-CM

## 2020-01-10 MED ORDER — AMPHETAMINE-DEXTROAMPHET ER 20 MG PO CP24: 20.0000 mg | ORAL_CAPSULE | Freq: Two times a day (BID) | ORAL | 0 refills | Status: DC

## 2020-01-10 MED ORDER — DIAZEPAM 5 MG PO TABS: 5.0000 mg | ORAL_TABLET | Freq: Two times a day (BID) | ORAL | 2 refills | Status: DC

## 2020-01-10 MED ORDER — AMPHETAMINE-DEXTROAMPHET ER 20 MG PO CP24: 20.0000 mg | ORAL_CAPSULE | Freq: Every day | ORAL | 0 refills | Status: DC

## 2020-01-10 NOTE — Progress Notes (Signed)
[facility] MD/PA/NP Initial Note  01/10/2020 1:33 PM [name]  MRN:  [id number]  Chief Complaint:   HPI:   Describes mood today as "ok". Pleasant. Mood symptoms - denies current symptoms of depression, anxiety, and irritability. Stating "I feel great". Transferring from current provider due to insurance requirements. Seeing a therapist. Feels like current medications are working well and would like to continue. Stable interest and motivation. Taking medications as prescribed.  Energy levels stable. Active, does not have a regular exercise routine.   Enjoys some usual interests and activities. Single. Lives alone with 97 year old son. Son attends [employer]. Spending time with family. Attends church. Close with neighbors.  Appetite adequate. Weight stable - 165 pounds. Trying to lose weight. Sleeps well most nights. Averages 7 to 8 hours. Focus and concentration difficulties in childhood. Diagnosed with ADHD around 5th grade. Taking Adderall XR 20mg  BID. Completing tasks. Managing aspects of household. Works full time . Denies SI or HI.  Denies AH or VH.  Previous medication trials: Multiple medication trials  Visit Diagnosis:    ICD-10-CM   1. PTSD (post-traumatic stress disorder)  F43.10   2. Generalized anxiety disorder  F41.1 diazepam (VALIUM) 5 MG tablet  3. Attention deficit hyperactivity disorder (ADHD), unspecified ADHD type  F90.9 amphetamine-dextroamphetamine (ADDERALL XR) 20 MG 24 hr capsule    amphetamine-dextroamphetamine (ADDERALL XR) 20 MG 24 hr capsule    amphetamine-dextroamphetamine (ADDERALL XR) 20 MG 24 hr capsule  4. Major depressive disorder, recurrent episode, moderate (HCC)  F33.1     Past Psychiatric History: Admitted at age [age] [facility] for 2 days.   Past Medical History:  Past Medical History:  Diagnosis Date  . ADHD (attention deficit hyperactivity disorder)   . Anxiety   . Depression   . Headache   . Hx of varicella     Past Surgical  History:  Procedure Laterality Date  . NO PAST SURGERIES      Family Psychiatric History: Family history of mental illness.  Family History:  Family History  Problem Relation Age of Onset  . Stroke Maternal Grandmother   . Anxiety disorder Maternal Grandmother   . Cancer Maternal Grandfather   . Stroke Maternal Grandfather   . ADD / ADHD Mother   . Alcoholism Father     Social History:  Social History   Socioeconomic History  . Marital status: Single    Spouse name: Not on file  . Number of children: Not on file  . Years of education: Not on file  . Highest education level: Not on file  Occupational History  . Not on file  Tobacco Use  . Smoking status: Former Smoker    Quit date: 06/25/2015    Years since quitting: 4.5  . Smokeless tobacco: Never Used  Vaping Use  . Vaping Use: Never used  Substance and Sexual Activity  . Alcohol use: No  . Drug use: No  . Sexual activity: Not on file  Other Topics Concern  . Not on file  Social History Narrative  . Not on file   Social Determinants of Health   Financial Resource Strain:   . Difficulty of Paying Living Expenses: Not on file  Food Insecurity:   . Worried About 08/25/2015 in the Last Year: Not on file  . Ran Out of Food in the Last Year: Not on file  Transportation Needs:   . Lack of Transportation (Medical): Not on file  . Lack of Transportation (  Non-Medical): Not on file  Physical Activity:   . Days of Exercise per Week: Not on file  . Minutes of Exercise per Session: Not on file  Stress:   . Feeling of Stress : Not on file  Social Connections:   . Frequency of Communication with Friends and Family: Not on file  . Frequency of Social Gatherings with Friends and Family: Not on file  . Attends Religious Services: Not on file  . Active Member of Clubs or Organizations: Not on file  . Attends Banker Meetings: Not on file  . Marital Status: Not on file    Allergies: No Known  Allergies  Metabolic Disorder Labs: No results found for: HGBA1C, MPG No results found for: PROLACTIN No results found for: CHOL, TRIG, HDL, CHOLHDL, VLDL, LDLCALC No results found for: TSH  Therapeutic Level Labs: No results found for: LITHIUM No results found for: VALPROATE No components found for:  CBMZ  Current Medications: Current Outpatient Medications  Medication Sig Dispense Refill  . amphetamine-dextroamphetamine (ADDERALL XR) 20 MG 24 hr capsule Take 1 capsule (20 mg total) by mouth daily. 60 capsule 0  . [START ON 02/07/2020] amphetamine-dextroamphetamine (ADDERALL XR) 20 MG 24 hr capsule Take 1 capsule (20 mg total) by mouth 2 (two) times daily. 60 capsule 0  . [START ON 03/06/2020] amphetamine-dextroamphetamine (ADDERALL XR) 20 MG 24 hr capsule Take 1 capsule (20 mg total) by mouth 2 (two) times daily. 60 capsule 0  . calcium carbonate (TUMS - DOSED IN MG ELEMENTAL CALCIUM) 500 MG chewable tablet Chew 1-2 tablets by mouth 3 (three) times daily as needed for indigestion or heartburn.    . diazepam (VALIUM) 10 MG tablet SMARTSIG:1 Tablet(s) By Mouth    . diazepam (VALIUM) 5 MG tablet Take 1 tablet (5 mg total) by mouth 2 (two) times daily. 60 tablet 2  . HYDROcodone-acetaminophen (NORCO/VICODIN) 5-325 MG tablet Take one by mouth at bedtime as needed for pain 5 tablet 0  . ibuprofen (ADVIL,MOTRIN) 600 MG tablet Take 1 tablet (600 mg total) by mouth every 6 (six) hours as needed. 30 tablet 2  . Omega-3 Fatty Acids (FISH OIL) 500 MG CAPS Take 1 capsule by mouth daily.    . polycarbophil (FIBERCON) 625 MG tablet Take 1,875 mg by mouth as needed for mild constipation.    . predniSONE (DELTASONE) 20 MG tablet Take one tab by mouth twice daily for 4 days, then one daily. Take with food. 12 tablet 0  . Prenatal Vit-Fe Fumarate-FA (PRENATAL MULTIVITAMIN) TABS tablet Take 1 tablet by mouth daily at 12 noon.     No current facility-administered medications for this visit.     Medication Side Effects: none  Orders placed this visit:  No orders of the defined types were placed in this encounter.   Psychiatric Specialty Exam:  Review of Systems  Musculoskeletal: Negative for gait problem.  Neurological: Negative for tremors.  Psychiatric/Behavioral:       Please refer to HPI    unknown if currently breastfeeding.There is no height or weight on file to calculate BMI.  General Appearance: Casual, Neat and Well Groomed  Eye Contact:  Good  Speech:  Clear and Coherent and Normal Rate  Volume:  Normal  Mood:  Euthymic  Affect:  Appropriate and Congruent  Thought Process:  Coherent and Descriptions of Associations: Intact  Orientation:  Full (Time, Place, and Person)  Thought Content: Logical   Suicidal Thoughts:  No  Homicidal Thoughts:  No  Memory:  WNL  Judgement:  Good  Insight:  Good  Psychomotor Activity:  Normal  Concentration:  Concentration: Good  Recall:  Good  Fund of Knowledge: Good  Language: Good  Assets:  Communication Skills Desire for Improvement Financial Resources/Insurance Housing Intimacy Leisure Time Physical Health Resilience Social Support Talents/Skills Transportation Vocational/Educational  ADL's:  Intact  Cognition: WNL  Prognosis:  Good   Screenings: None  Receiving Psychotherapy: Yes - Stevphen Meuse  Treatment Plan/Recommendations:   Plan:  PDMP reviewed  1. Adderall XR 20mg  twice daily 2. Valium 5mg  BID  Read and reviewed note with patient for accuracy.   RTC 3 months  Patient advised to contact office with any questions, adverse effects, or acute worsening in signs and symptoms.  Discussed potential benefits, risk, and side effects of benzodiazepines to include potential risk of tolerance and dependence, as well as possible drowsiness.  Advised patient not to drive if experiencing drowsiness and to take lowest possible effective dose to minimize risk of dependence and tolerance.  Discussed  potential benefits, risks, and side effects of stimulants with patient to include increased heart rate, palpitations, insomnia, increased anxiety, increased irritability, or decreased appetite.  Instructed patient to contact office if experiencing any significant tolerability issues.  , NP

## 2020-01-24 ENCOUNTER — Ambulatory Visit (INDEPENDENT_AMBULATORY_CARE_PROVIDER_SITE_OTHER): Payer: 59 | Admitting: Psychiatry

## 2020-01-24 DIAGNOSIS — F4310 Post-traumatic stress disorder, unspecified: Secondary | ICD-10-CM
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
## 2020-02-01 ENCOUNTER — Ambulatory Visit (INDEPENDENT_AMBULATORY_CARE_PROVIDER_SITE_OTHER): Payer: 59 | Admitting: Psychiatry

## 2020-02-01 DIAGNOSIS — F4310 Post-traumatic stress disorder, unspecified: Secondary | ICD-10-CM | POA: Diagnosis not present
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
## 2020-02-06 ENCOUNTER — Ambulatory Visit (INDEPENDENT_AMBULATORY_CARE_PROVIDER_SITE_OTHER): Payer: 59 | Admitting: Psychiatry

## 2020-02-06 DIAGNOSIS — F4310 Post-traumatic stress disorder, unspecified: Secondary | ICD-10-CM | POA: Diagnosis not present
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
## 2020-02-08 ENCOUNTER — Ambulatory Visit: Payer: 59 | Admitting: Psychiatry

## 2020-02-08 ENCOUNTER — Other Ambulatory Visit: Payer: Self-pay

## 2020-02-08 ENCOUNTER — Emergency Department (INDEPENDENT_AMBULATORY_CARE_PROVIDER_SITE_OTHER)
Admission: RE | Admit: 2020-02-08 | Discharge: 2020-02-08 | Disposition: A | Discharge status: Home / Self Care | Payer: 59 | Source: Ambulatory Visit

## 2020-02-08 ENCOUNTER — Emergency Department (INDEPENDENT_AMBULATORY_CARE_PROVIDER_SITE_OTHER): Payer: 59

## 2020-02-08 VITALS — BP 134/93 | HR 110 | Temp 98.1°F | Resp 17

## 2020-02-08 DIAGNOSIS — M79672 Pain in left foot: Secondary | ICD-10-CM

## 2020-02-08 DIAGNOSIS — S93402A Sprain of unspecified ligament of left ankle, initial encounter: Secondary | ICD-10-CM | POA: Diagnosis not present

## 2020-02-08 DIAGNOSIS — R2242 Localized swelling, mass and lump, left lower limb: Secondary | ICD-10-CM

## 2020-02-08 DIAGNOSIS — M25572 Pain in left ankle and joints of left foot: Secondary | ICD-10-CM | POA: Diagnosis not present

## 2020-02-08 NOTE — ED Provider Notes (Signed)
[name]    CSN: [id number] Arrival date & time: 02/08/20  1550      History   Chief Complaint Chief Complaint  Patient presents with  . Appointment  . Ankle Pain    HPI [name] is a 34 y.o. female.   HPI [name] is a 34 y.o. female presenting to UC with c/o Left ankle pain and swelling with some pain into her Left foot. She rolled her Left ankle a few weeks ago, which caused it to be sore but yesterday, she rolled it again and heard a "pop."  Pain was more severe than initial injury.  She is able to walk on it but pain is worse with certain movements.  She took Goody's Powder at [employer] today with moderate relief.  No prior fracture to same ankle.   Past Medical History:  Diagnosis Date  . ADHD (attention deficit hyperactivity disorder)   . Anxiety   . Depression   . Headache   . Hx of varicella     Patient Active Problem List   Diagnosis Date Noted  . PTSD (post-traumatic stress disorder) 08/23/2019  . Aggressive behavior 08/23/2019  . Involuntary commitment 08/23/2019  . Adjustment disorder with mixed anxiety and depressed mood 07/26/2019  . ADHD (attention deficit hyperactivity disorder), combined type 07/15/2017  . Vaginal delivery 12/28/2015  . Anxiety 12/25/2015  . Depression 12/25/2015  . Positive GBS test 12/25/2015  . Post-dates pregnancy 12/25/2015  . Smoker 06/30/2015  . Hx of ovarian cyst 06/30/2015  . History of ADHD 06/30/2015  . Bilateral sensorineural hearing loss 12/13/2012  . Otalgia 12/13/2012  . Tinnitus 12/13/2012  . 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 NO PAST SURGERIES      OB History    Gravida  2   Para  1   Term  1   Preterm      AB  1   Living  1     SAB      TAB      Ectopic      Multiple  0   Live Births  1            Home Medications    Prior to Admission medications   Medication Sig Start Date End Date Taking? Authorizing Provider   amphetamine-dextroamphetamine (ADDERALL XR) 20 MG 24 hr capsule Take 1 capsule (20 mg total) by mouth 2 (two) times daily. 02/07/20  Yes Mozingo, Thereasa Solo, NP  diazepam (VALIUM) 5 MG tablet Take 1 tablet (5 mg total) by mouth 2 (two) times daily. 01/10/20  Yes Mozingo, Thereasa Solo, NP  gabapentin (NEURONTIN) 300 MG capsule Take 300-600 mg by mouth 3 (three) times daily. 02/01/20  Yes [provider]  amphetamine-dextroamphetamine (ADDERALL XR) 20 MG 24 hr capsule Take 1 capsule (20 mg total) by mouth daily. 01/10/20   Mozingo, Thereasa Solo, NP  amphetamine-dextroamphetamine (ADDERALL XR) 20 MG 24 hr capsule Take 1 capsule (20 mg total) by mouth 2 (two) times daily. 03/06/20   Mozingo, Thereasa Solo, NP  calcium carbonate (TUMS - DOSED IN MG ELEMENTAL CALCIUM) 500 MG chewable tablet Chew 1-2 tablets by mouth 3 (three) times daily as needed for indigestion or heartburn.    [provider]  diazepam (VALIUM) 10 MG tablet SMARTSIG:1 Tablet(s) By Mouth 12/14/19   [provider]  HYDROcodone-acetaminophen (NORCO/VICODIN) 5-325 MG tablet Take one by mouth at bedtime as needed for pain 04/17/19  Lattie Haw, MD  ibuprofen (ADVIL,MOTRIN) 600 MG tablet Take 1 tablet (600 mg total) by mouth every 6 (six) hours as needed. 12/29/15   Nigel Bridgeman, CNM  Omega-3 Fatty Acids (FISH OIL) 500 MG CAPS Take 1 capsule by mouth daily.    [provider]  polycarbophil (FIBERCON) 625 MG tablet Take 1,875 mg by mouth as needed for mild constipation.    [provider]  predniSONE (DELTASONE) 20 MG tablet Take one tab by mouth twice daily for 4 days, then one daily. Take with food. 04/17/19   Lattie Haw, MD  Prenatal Vit-Fe Fumarate-FA (PRENATAL MULTIVITAMIN) TABS tablet Take 1 tablet by mouth daily at 12 noon.    [provider]    Family History Family History  Problem Relation Age of Onset  . Stroke Maternal Grandmother   . Anxiety  disorder Maternal Grandmother   . Cancer Maternal Grandfather   . Stroke Maternal Grandfather   . ADD / ADHD Mother   . Alcoholism Father   . Bipolar disorder Sister     Social History Social History   Tobacco Use  . Smoking status: Former Smoker    Quit date: 06/25/2015    Years since quitting: 4.6  . Smokeless tobacco: Never Used  Vaping Use  . Vaping Use: Never used  Substance Use Topics  . Alcohol use: No  . Drug use: No     Allergies   Patient has no known allergies.   Review of Systems Review of Systems  Musculoskeletal: Positive for arthralgias and joint swelling. Negative for gait problem.  Skin: Negative for color change and wound.     Physical Exam Triage Vital Signs ED Triage Vitals  Enc Vitals Group     BP 02/08/20 1612 (!) 134/93     Pulse Rate 02/08/20 1612 (!) 110     Resp 02/08/20 1612 17     Temp 02/08/20 1612 98.1 F (36.7 C)     Temp Source 02/08/20 1612 Oral     SpO2 02/08/20 1612 98 %     Weight --      Height --      Head Circumference --      Peak Flow --      Pain Score 02/08/20 1615 1     Pain Loc --      Pain Edu? --      Excl. in GC? --    No data found.  Updated Vital Signs BP (!) 134/93 (BP Location: Right Arm)   Pulse (!) 110   Temp 98.1 F (36.7 C) (Oral)   Resp 17   LMP 01/25/2020 (Approximate)   SpO2 98%   Breastfeeding No   Visual Acuity Right Eye Distance:   Left Eye Distance:   Bilateral Distance:    Right Eye Near:   Left Eye Near:    Bilateral Near:     Physical Exam Vitals and nursing note reviewed.  Constitutional:      Appearance: She is well-developed.  HENT:     Head: Normocephalic and atraumatic.  Cardiovascular:     Rate and Rhythm: Normal rate.  Pulmonary:     Effort: Pulmonary effort is normal.  Musculoskeletal:        General: Swelling and tenderness present. Normal range of motion.     Cervical back: Normal range of motion.     Left ankle: No deformity. Tenderness present. Normal  range of motion.     Left Achilles Tendon: Normal.  Feet:  Skin:    General: Skin is warm and dry.     Capillary Refill: Capillary refill takes less than 2 seconds.     Findings: No bruising or erythema.  Neurological:     Mental Status: She is alert and oriented to person, place, and time.     Sensory: No sensory deficit.  Psychiatric:        Behavior: Behavior normal.      UC Treatments / Results  Labs (all labs ordered are listed, but only abnormal results are displayed) Labs Reviewed - No data to display  EKG   Radiology DG Ankle Complete Left  Result Date: 02/08/2020 CLINICAL DATA:  Left ankle pain and swelling. Twisted left ankle multiple times over the past 2 weeks. EXAM: LEFT ANKLE COMPLETE - 3+ VIEW COMPARISON:  None. FINDINGS: No acute fracture or dislocation is identified. Joint space widths are preserved. There is mild soft tissue swelling about the lateral aspect of the ankle. IMPRESSION: Soft tissue swelling without acute osseous abnormality identified. Electronically Signed   By: Sebastian Ache M.D.   On: 02/08/2020 16:49   DG Foot Complete Left  Result Date: 02/08/2020 CLINICAL DATA:  Left foot pain after multiple twisting injuries EXAM: LEFT FOOT - COMPLETE 3+ VIEW COMPARISON:  None. FINDINGS: There is no evidence of fracture or dislocation. There is no evidence of arthropathy or other focal bone abnormality. Soft tissues are unremarkable. IMPRESSION: Negative. Electronically Signed   By: Duanne Guess D.O.   On: 02/08/2020 16:49    Procedures Procedures (including critical care time)  Medications Ordered in UC Medications - No data to display  Initial Impression / Assessment and Plan / UC Course  I have reviewed the triage vital signs and the nursing notes.  Pertinent labs & imaging results that were available during my care of the patient were reviewed by me and considered in my medical decision making (see chart for details).     Reassured  pt of normal imaging  Will tx as sprain, ace wrap and stirrup splint provided for comfort. F/u with Sports Medicine in 1-2 weeks as needed  Final Clinical Impressions(s) / UC Diagnoses   Final diagnoses:  Sprain of left ankle, unspecified ligament, initial encounter  Left foot pain     Discharge Instructions      You may take 500mg  acetaminophen every 4-6 hours or in combination with ibuprofen 400-600mg  every 6-8 hours as needed for pain and inflammation.  You may use the ankle brace for comfort and support. Call to schedule a follow up appointment with Sports Medicine in 1-2 weeks if not improving.      ED Prescriptions    None     PDMP not reviewed this encounter.   , Lurene Shadow 02/08/20 1702

## 2020-02-08 NOTE — ED Triage Notes (Signed)
Pt rolled left ankle 2 weeks ago, rest and ice w/ elevation x 1 day No compression  Pt has rolled the left ankle  2 more times  Since then - last time was yesterday & pt felt a pop w/ sharp pain Goody's Powder at 3pm today - provided relief Swelling noted to outside of left ankle  COVID Moderna vaccine

## 2020-02-08 NOTE — Discharge Instructions (Signed)
  You may take 500mg  acetaminophen every 4-6 hours or in combination with ibuprofen 400-600mg  every 6-8 hours as needed for pain and inflammation.  You may use the ankle brace for comfort and support. Call to schedule a follow up appointment with Sports Medicine in 1-2 weeks if not improving.

## 2020-02-14 ENCOUNTER — Ambulatory Visit (INDEPENDENT_AMBULATORY_CARE_PROVIDER_SITE_OTHER): Payer: 59 | Admitting: Psychiatry

## 2020-02-14 DIAGNOSIS — F4310 Post-traumatic stress disorder, unspecified: Secondary | ICD-10-CM | POA: Diagnosis not present

## 2020-02-14 NOTE — Progress Notes (Signed)
    [facility] Counselor/Therapist Progress Note  Patient ID: [name], MRN: [id number],    Date: 02/14/2020  Time Spent: 52 minutes start time 1:01 PM end time 1:53 PM Virtual Visit via Telephone Note Connected with patient by a video enabled telemedicine/telehealth application, with their informed consent, and verified patient privacy and that I am speaking with the correct person using two identifiers. I discussed the limitations, risks, security and privacy concerns of performing psychotherapy and management service by telephone and the availability of in person appointments. I also discussed with the patient that there may be a patient responsible charge related to this service. The patient expressed understanding and agreed to proceed. I discussed the treatment planning with the patient. The patient was provided an opportunity to ask questions and all were answered. The patient agreed with the plan and demonstrated an understanding of the instructions. The patient was advised to call  our office if  symptoms worsen or feel they are in a crisis state and need immediate contact.   Therapist Location: office Patient Location: home     Treatment Type: Individual Therapy  Reported Symptoms: anxiety, sadness, triggered responses, crying spells  Mental Status Exam:  Appearance:   Well Groomed     Behavior:  Appropriate  Motor:  Normal  Speech/Language:   Normal Rate  Affect:  Appropriate  Mood:  anxious and sad  Thought process:  normal  Thought content:    WNL  Sensory/Perceptual disturbances:    WNL  Orientation:  oriented to person, place, time/date and situation  Attention:  Good  Concentration:  Good  Memory:  WNL  Fund of knowledge:   Good  Insight:    Good  Judgment:   Good  Impulse Control:  Good   Risk Assessment: Danger to Self:  No Self-injurious Behavior: No Danger to Others: No Duty to Warn:no Physical Aggression / Violence:No  Access to Firearms a  concern: No  Gang Involvement:No   Subjective: Met with patient via virtual session.  She shared that she was physically pained and exhausted by the session with her mother and her therapist.  Patient was able to recognize she just has to move forward from her family.  Patient had explained she had a phone call after last session with her mother's therapists and he reported that things due to the issues things may not be fixable.  She is also struggling because she has had multiple interviews but nothing is coming from them.  Patient was encouraged to figure out what was the most triggering thing for her currently.  She shared she read the report from when they took out an involuntary commitment order on patient.  Patient reported in the report were lots of things that just were not true about her and made her very sad to think her family would even accuse her of such terrible things.  Patient did EMDR set on the report, suds level 10, negative cognition "I am unlovable" felt pain and hurt in her throat and arms.  Patient was able to reduce suds level to 5.  She was able to acknowledge that the things in the report just were not true and she does not have to hang onto those things.  She was also able to realize more that it is time she just moves on from her family.  Patient was also able to acknowledge that she is other people in her life that are wanting to have relationships with her and she needs   to pursue those options rather than holding on to hope that things will be different.  Patient reported feeling better at the end of session and agreed to take time to take care of herself through exercise.  Interventions: Solution-Oriented/Positive Psychology and Eye Movement Desensitization and Reprocessing (EMDR)  Diagnosis:   ICD-10-CM   1. PTSD (post-traumatic stress disorder)  F43.10     Plan: Patient is to use CBT and coping skills to decrease triggered responses.  Patient is to focus on finding a new  job and developing relationships with people in the neighborhood and in her church.  Patient is to use exercise as a way to release negative emotions appropriately.  Patient is to not have contact with her family at this time. Long-term goal: Recall the traumatic event without becoming overwhelming negative emotions Short-term goal: Practice implement relaxation training as a coping mechanism for tension panic stress anger and anxiety  [name], [facility]

## 2020-02-27 ENCOUNTER — Ambulatory Visit (INDEPENDENT_AMBULATORY_CARE_PROVIDER_SITE_OTHER): Payer: 59 | Admitting: Psychiatry

## 2020-02-27 DIAGNOSIS — F4310 Post-traumatic stress disorder, unspecified: Secondary | ICD-10-CM | POA: Diagnosis not present

## 2020-02-27 NOTE — Progress Notes (Signed)
[facility] Counselor/Therapist Progress Note  Patient ID: [name], MRN: [id number],    Date: 02/27/2020  Time Spent: 52 minutes start time 2:00 PM end time 2:52 PM Virtual Visit via Telephone Note Connected with patient by a video enabled telemedicine/telehealth application, with their informed consent, and verified patient privacy and that I am speaking with the correct person using two identifiers. I discussed the limitations, risks, security and privacy concerns of performing psychotherapy and management service by telephone and the availability of in person appointments. I also discussed with the patient that there may be a patient responsible charge related to this service. The patient expressed understanding and agreed to proceed. I discussed the treatment planning with the patient. The patient was provided an opportunity to ask questions and all were answered. The patient agreed with the plan and demonstrated an understanding of the instructions. The patient was advised to call  our office if  symptoms worsen or feel they are in a crisis state and need immediate contact.   Therapist Location: office Patient Location: home    Treatment Type: Individual Therapy  Reported Symptoms: anxiety, triggered responses, sadness, low self-esteem, tearfulness  Mental Status Exam:  Appearance:   Well Groomed     Behavior:  Appropriate  Motor:  Normal  Speech/Language:   Normal Rate  Affect:  Appropriate  Mood:  anxious  Thought process:  normal  Thought content:    WNL  Sensory/Perceptual disturbances:    WNL  Orientation:  oriented to person, place, time/date and situation  Attention:  Good  Concentration:  Good  Memory:  WNL  Fund of knowledge:   Good  Insight:    Good  Judgment:   Good  Impulse Control:  Good   Risk Assessment: Danger to Self:  No Self-injurious Behavior: No Danger to Others: No Duty to Warn:no Physical Aggression / Violence:No  Access to Firearms a  concern: No  Gang Involvement:No   Subjective: Met with patient via virtual session.  Patient  Reported that she is still trying to find a job which is bringing up anxiety for her.  She has changed her phone number and was not having contact with her family. She went on to share that she started emailing her mother again because she was concerned her son was upset over not having contact with his grandparents. She went on to share it went good for a while but than it took a turn and she had to end contact again.  She shared that she read a book that helped her realize that being broke is temporary and she will find a job.  Patient did EMDR set on seeing herself and her son, suds level 59, negative cognition "I am not wanted" felt sadness in her mouth and jaw.  Patient was able to reduce as level to 3.  She was able to remind herself that she was not the issue it was her mother that was the issue and still is.  Patient recalled different traumatic events from childhood including her mother going to bed and crying hysterically when patient was young and she and her sister went to bed without eating because they could not get their mother to calm down.  Patient was encouraged to continue exercising and working on her regular affirmations that she is enough and she is wanted.   Interventions: Solution-Oriented/Positive Psychology and Eye Movement Desensitization and Reprocessing (EMDR)  Diagnosis:   ICD-10-CM   1. PTSD (post-traumatic stress disorder)  F43.10     Plan: Patient is to use CBT and coping skills to decrease triggered responses.  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  Patient is to continue trying to find a job.  Patient is to write down her successes to remind herself of them regularly. Long-term goal: Recall the traumatic event without becoming overwhelming negative emotions Short-term goal: Practice implement relaxation training as a coping mechanism for  tension panic stress anger and anxiety  [name], [facility]

## 2020-03-02 ENCOUNTER — Other Ambulatory Visit: Payer: Self-pay

## 2020-03-02 ENCOUNTER — Emergency Department (INDEPENDENT_AMBULATORY_CARE_PROVIDER_SITE_OTHER)
Admission: RE | Admit: 2020-03-02 | Discharge: 2020-03-02 | Disposition: A | Discharge status: Home / Self Care | Payer: 59 | Source: Ambulatory Visit

## 2020-03-02 DIAGNOSIS — Z23 Encounter for immunization: Secondary | ICD-10-CM

## 2020-03-02 MED ORDER — RABIES VACCINE, PCEC IM SUSR
1.0000 mL | Freq: Once | INTRAMUSCULAR | Status: AC
  Administered 2020-03-02: 1 mL via INTRAMUSCULAR

## 2020-03-02 NOTE — ED Triage Notes (Signed)
Patient here for 2nd in series of rabies vaccinations; first evaluation at [facility] ER; has paperwork.

## 2020-03-05 ENCOUNTER — Ambulatory Visit (INDEPENDENT_AMBULATORY_CARE_PROVIDER_SITE_OTHER): Payer: 59 | Admitting: Psychiatry

## 2020-03-05 DIAGNOSIS — F4310 Post-traumatic stress disorder, unspecified: Secondary | ICD-10-CM | POA: Diagnosis not present

## 2020-03-05 NOTE — Progress Notes (Signed)
[facility] Counselor/Therapist Progress Note  Patient ID: [name], MRN: [id number],    Date: 03/05/2020  Time Spent: 52 minutes start time 11:03 AM end time 11:55 AM Virtual Visit via Telephone Note Connected with patient by a video enabled telemedicine/telehealth application, with their informed consent, and verified patient privacy and that I am speaking with the correct person using two identifiers. I discussed the limitations, risks, security and privacy concerns of performing psychotherapy and management service by telephone and the availability of in person appointments. I also discussed with the patient that there may be a patient responsible charge related to this service. The patient expressed understanding and agreed to proceed. I discussed the treatment planning with the patient. The patient was provided an opportunity to ask questions and all were answered. The patient agreed with the plan and demonstrated an understanding of the instructions. The patient was advised to call  our office if  symptoms worsen or feel they are in a crisis state and need immediate contact.   Therapist Location: office Patient Location: home    Treatment Type: Individual Therapy  Reported Symptoms: depression, anxiety, triggered responses, frustration, sleep issues,   Mental Status Exam:  Appearance:   Casual and Neat     Behavior:  Appropriate  Motor:  Normal  Speech/Language:   Normal Rate  Affect:  Appropriate tearful  Mood:  sad  Thought process:  normal  Thought content:    WNL  Sensory/Perceptual disturbances:    WNL  Orientation:  oriented to person, place, time/date and situation  Attention:  Good  Concentration:  Good  Memory:  WNL  Fund of knowledge:   Good  Insight:    Good  Judgment:   Good  Impulse Control:  Good   Risk Assessment: Danger to Self:  No Self-injurious Behavior: No Danger to Others: No Duty to Warn:no Physical Aggression / Violence:No  Access  to Firearms a concern: No  Gang Involvement:No   Subjective: Patient was present for session.  She shared that she was bit by a cat and had to get rabbis shots.  She is continuing to have sadness over several different situations. Patient shared other things from the past that were traumatic including getting kicked out of her mother's house again. Discussed that is why her current situation is so triggering for her. Patient went on to share that she had an incident with past coworker that was triggering. It brought back memories of her head which are yelling at her, did EMDR set on that issue. Suds level 10, negative cognition "I can't measure up" felt sadness in her in her chest. Patient was able to reduce suds level to 3. She was able to recognize she seems to fall into a pattern of being with people who are similar to her mother. Patient was also able to identify the fact that she has gifts and strengths and she is talented. The importance of affirming herself regularly was discussed with patient. Patient was also encouraged to make sure she is exercising to get the negative emotions out of herself in a regular manner.  Interventions: Solution-Oriented/Positive Psychology and Eye Movement Desensitization and Reprocessing (EMDR)  Diagnosis:   ICD-10-CM   1. PTSD (post-traumatic stress disorder)  F43.10     Plan: Patient is to use CBT and coping skills to decrease triggered responses. Patient is to work on Secretary/administrator and exercising regularly to decrease negative emotions. Patient is to continue working on finding a new job and  spending positive time with her son. Long-term goal: Recall the traumatic event without becoming overwhelmed with negative emotions Short-term goal: Practice implement relaxation training as a coping mechanism for tension panic stress anger and anxiety  Stevphen Meuse, [facility]

## 2020-03-06 ENCOUNTER — Emergency Department (INDEPENDENT_AMBULATORY_CARE_PROVIDER_SITE_OTHER)
Admission: EM | Admit: 2020-03-06 | Discharge: 2020-03-06 | Disposition: A | Discharge status: Home / Self Care | Payer: 59 | Source: Home / Self Care

## 2020-03-06 ENCOUNTER — Ambulatory Visit: Payer: Self-pay

## 2020-03-06 DIAGNOSIS — Z23 Encounter for immunization: Secondary | ICD-10-CM

## 2020-03-06 DIAGNOSIS — Z203 Contact with and (suspected) exposure to rabies: Secondary | ICD-10-CM

## 2020-03-06 MED ORDER — RABIES VACCINE, PCEC IM SUSR
1.0000 mL | Freq: Once | INTRAMUSCULAR | Status: AC
  Administered 2020-03-06: 1 mL via INTRAMUSCULAR

## 2020-03-06 NOTE — ED Triage Notes (Signed)
Patient here for rabies injection, given in left deltoid.

## 2020-03-07 ENCOUNTER — Ambulatory Visit (INDEPENDENT_AMBULATORY_CARE_PROVIDER_SITE_OTHER): Payer: 59 | Admitting: Psychiatry

## 2020-03-07 DIAGNOSIS — F4310 Post-traumatic stress disorder, unspecified: Secondary | ICD-10-CM | POA: Diagnosis not present
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
## 2020-03-12 ENCOUNTER — Other Ambulatory Visit: Payer: Self-pay

## 2020-03-12 ENCOUNTER — Emergency Department (INDEPENDENT_AMBULATORY_CARE_PROVIDER_SITE_OTHER)
Admission: EM | Admit: 2020-03-12 | Discharge: 2020-03-12 | Disposition: A | Discharge status: Home / Self Care | Payer: 59 | Source: Home / Self Care

## 2020-03-12 DIAGNOSIS — Z23 Encounter for immunization: Secondary | ICD-10-CM | POA: Diagnosis not present

## 2020-03-12 MED ORDER — RABIES VACCINE, PCEC IM SUSR
1.0000 mL | Freq: Once | INTRAMUSCULAR | Status: AC
  Administered 2020-03-12: 1 mL via INTRAMUSCULAR

## 2020-03-12 NOTE — ED Triage Notes (Signed)
Pt here for her 4th rabies vaccine- no reactions w/ past vaccines

## 2020-03-21 ENCOUNTER — Ambulatory Visit (INDEPENDENT_AMBULATORY_CARE_PROVIDER_SITE_OTHER): Payer: 59 | Admitting: Psychiatry

## 2020-03-21 DIAGNOSIS — F4310 Post-traumatic stress disorder, unspecified: Secondary | ICD-10-CM

## 2020-03-21 NOTE — Progress Notes (Signed)
[facility] Counselor/Therapist Progress Note  Patient ID: Zorana Brockwell, MRN: [id number],    Date: 03/21/2020  Time Spent: 52 minutes start time 10:59 AM end time 11:51 AM Virtual Visit via Telephone Note Connected with patient by a video enabled telemedicine/telehealth application, with their informed consent, and verified patient privacy and that I am speaking with the correct person using two identifiers. I discussed the limitations, risks, security and privacy concerns of performing psychotherapy and management service by telephone and the availability of in person appointments. I also discussed with the patient that there may be a patient responsible charge related to this service. The patient expressed understanding and agreed to proceed. I discussed the treatment planning with the patient. The patient was provided an opportunity to ask questions and all were answered. The patient agreed with the plan and demonstrated an understanding of the instructions. The patient was advised to call  our office if  symptoms worsen or feel they are in a crisis state and need immediate contact.   Therapist Location: office Patient Location: home    Treatment Type: Individual Therapy  Reported Symptoms: anxiety, triggered responses crying spells, sadness  Mental Status Exam:  Appearance:   Casual and Neat     Behavior:  Appropriate  Motor:  Normal  Speech/Language:   Normal Rate  Affect:  Appropriate tearfulness  Mood:  normal  Thought process:  normal  Thought content:    WNL  Sensory/Perceptual disturbances:    WNL  Orientation:  oriented to person, place, time/date and situation  Attention:  Good  Concentration:  Good  Memory:  WNL  Fund of knowledge:   Good  Insight:    Good  Judgment:   Good  Impulse Control:  Good   Risk Assessment: Danger to Self:  No Self-injurious Behavior: No Danger to Others: No Duty to Warn:no Physical Aggression / Violence:No  Access to Firearms a  concern: No  Gang Involvement:No   Subjective: Met with patient via virtual session.  She shared that she was feeling better.  She got a job and that is a relief.  She did make contact with Danny's therapist because she started thinking about them again. The conversation did not seem to go anywhere.  She has had some contact with her family online which has triggered her. Patient did an EMDR set on Picture of them together SUDS level 10, negative cognition "I'm worthless", felt hurt and anger in chest. Patient was able to reduce SUDS level to 3.  She shared she is not feeling worthless. Reminded her to focus on the fact that her value and worth does not come from them.  She shared she realizes that she is needing to release the emotions appropriately so she will go for a run.   Interventions: Solution-Oriented/Positive Psychology and Eye Movement Desensitization and Reprocessing (EMDR)  Diagnosis:   ICD-10-CM   1. PTSD (post-traumatic stress disorder)  F43.10     Plan: Patient is to use CBT and coping skills to decrease triggered responses.  Patient is to continue distancing from her family.  Patient is to continue running to release emotions appropriately.  Patient is to remind herself of the ways that she has value and worth regularly. Long-term goal: Recall the traumatic event without becoming overwhelmed with negative emotions Short-term goal: Practice implement relaxation training as a coping mechanism for tension panic stress anger and anxiety  [name], [facility]

## 2020-04-02 ENCOUNTER — Encounter: Payer: Self-pay | Admitting: Adult Health

## 2020-04-02 ENCOUNTER — Telehealth (INDEPENDENT_AMBULATORY_CARE_PROVIDER_SITE_OTHER): Payer: 59 | Admitting: Adult Health

## 2020-04-02 DIAGNOSIS — F331 Major depressive disorder, recurrent, moderate: Secondary | ICD-10-CM

## 2020-04-02 DIAGNOSIS — F411 Generalized anxiety disorder: Secondary | ICD-10-CM

## 2020-04-02 DIAGNOSIS — F4310 Post-traumatic stress disorder, unspecified: Secondary | ICD-10-CM

## 2020-04-02 DIAGNOSIS — F909 Attention-deficit hyperactivity disorder, unspecified type: Secondary | ICD-10-CM | POA: Diagnosis not present

## 2020-04-02 MED ORDER — AMPHETAMINE-DEXTROAMPHET ER 20 MG PO CP24: 20.0000 mg | ORAL_CAPSULE | Freq: Every day | ORAL | 0 refills | Status: DC

## 2020-04-02 MED ORDER — AMPHETAMINE-DEXTROAMPHET ER 20 MG PO CP24: 20.0000 mg | ORAL_CAPSULE | Freq: Two times a day (BID) | ORAL | 0 refills | Status: DC

## 2020-04-02 MED ORDER — DIAZEPAM 5 MG PO TABS: 5.0000 mg | ORAL_TABLET | Freq: Two times a day (BID) | ORAL | 2 refills | Status: DC

## 2020-04-02 NOTE — Progress Notes (Signed)
[name] [id number] 18-Jan-1986 34 y.o.  Subjective:   Patient ID:  [name] is a 34 y.o. (DOB December 05, 1985) female.  Chief Complaint: No chief complaint on file.   HPI [name] presents to the office today for follow-up of MDD, GAD, PTSD, and ADHD.  Describes mood today as "ok". Pleasant. Mood symptoms - denies depression, anxiety, and irritability. Stating "I'm doing pretty good". Has started a new job. Has been able to refinance home. Recently rescued a dog. Ongoing family conflict. She and son doing well. Seeing a therapist. Feels like current medications are working well for her. Stable interest and motivation. Taking medications as prescribed.  Energy levels stable. Active, does not have a regular exercise routine. Walking more - 2 dogs. Enjoys some usual interests and activities. Single. Lives alone with 58 year old son and 2 dogs. Son attends [employer]. Spending time with family. Attends church. Close with neighbors.  Appetite adequate. Weight loss 7 pounds - "I have been trying to lose weight" - 157 pounds.  Sleeps well most nights. Averages 7 to 8 hours. Focus and concentration stable. Taking Adderall XR 20mg  BID and feels it works well for her. Completing tasks. Managing aspects of household. Works full time . Denies SI or HI. Denies AH or VH.  Previous medication trials: Multiple medication trials     Review of Systems:  Review of Systems  Musculoskeletal: Negative for gait problem.  Neurological: Negative for tremors.  Psychiatric/Behavioral:       Please refer to HPI    Medications: I have reviewed the patient's current medications.  Current Outpatient Medications  Medication Sig Dispense Refill  . amphetamine-dextroamphetamine (ADDERALL XR) 20 MG 24 hr capsule Take 1 capsule (20 mg total) by mouth daily. 60 capsule 0  . [START ON 04/30/2020] amphetamine-dextroamphetamine (ADDERALL XR) 20 MG 24 hr capsule Take 1 capsule (20 mg total) by mouth 2 (two)  times daily. 60 capsule 0  . [START ON 05/28/2020] amphetamine-dextroamphetamine (ADDERALL XR) 20 MG 24 hr capsule Take 1 capsule (20 mg total) by mouth 2 (two) times daily. 60 capsule 0  . calcium carbonate (TUMS - DOSED IN MG ELEMENTAL CALCIUM) 500 MG chewable tablet Chew 1-2 tablets by mouth 3 (three) times daily as needed for indigestion or heartburn.    . diazepam (VALIUM) 10 MG tablet SMARTSIG:1 Tablet(s) By Mouth    . diazepam (VALIUM) 5 MG tablet Take 1 tablet (5 mg total) by mouth 2 (two) times daily. 60 tablet 2  . gabapentin (NEURONTIN) 300 MG capsule Take 300-600 mg by mouth 3 (three) times daily.    07/26/2020 HYDROcodone-acetaminophen (NORCO/VICODIN) 5-325 MG tablet Take one by mouth at bedtime as needed for pain 5 tablet 0  . ibuprofen (ADVIL,MOTRIN) 600 MG tablet Take 1 tablet (600 mg total) by mouth every 6 (six) hours as needed. 30 tablet 2  . Omega-3 Fatty Acids (FISH OIL) 500 MG CAPS Take 1 capsule by mouth daily.    . polycarbophil (FIBERCON) 625 MG tablet Take 1,875 mg by mouth as needed for mild constipation.    . predniSONE (DELTASONE) 20 MG tablet Take one tab by mouth twice daily for 4 days, then one daily. Take with food. 12 tablet 0  . Prenatal Vit-Fe Fumarate-FA (PRENATAL MULTIVITAMIN) TABS tablet Take 1 tablet by mouth daily at 12 noon.     No current facility-administered medications for this visit.    Medication Side Effects: None  Allergies: No Known Allergies  Past Medical History:  Diagnosis Date  .  ADHD (attention deficit hyperactivity disorder)   . Anxiety   . Depression   . Headache   . Hx of varicella     Family History  Problem Relation Age of Onset  . Stroke Maternal Grandmother   . Anxiety disorder Maternal Grandmother   . Cancer Maternal Grandfather   . Stroke Maternal Grandfather   . ADD / ADHD Mother   . Alcoholism Father   . Bipolar disorder Sister     Social History   Socioeconomic History  . Marital status: Single    Spouse name: Not on  file  . Number of children: Not on file  . Years of education: Not on file  . Highest education level: Not on file  Occupational History  . Not on file  Tobacco Use  . Smoking status: Former Smoker    Quit date: 06/25/2015    Years since quitting: 4.7  . Smokeless tobacco: Never Used  Vaping Use  . Vaping Use: Never used  Substance and Sexual Activity  . Alcohol use: No  . Drug use: No  . Sexual activity: Not on file  Other Topics Concern  . Not on file  Social History Narrative  . Not on file   Social Determinants of Health   Financial Resource Strain: Not on file  Food Insecurity: Not on file  Transportation Needs: Not on file  Physical Activity: Not on file  Stress: Not on file  Social Connections: Not on file  Intimate Partner Violence: Not on file    Past Medical History, Surgical history, Social history, and Family history were reviewed and updated as appropriate.   Please see review of systems for further details on the patient's review from today.   Objective:   Physical Exam:  There were no vitals taken for this visit.  Physical Exam Neurological:     Mental Status: She is alert and oriented to person, place, and time.     Cranial Nerves: No dysarthria.  Psychiatric:        Attention and Perception: Attention and perception normal.        Mood and Affect: Mood normal.        Speech: Speech normal.        Behavior: Behavior is cooperative.        Thought Content: Thought content normal. Thought content is not paranoid or delusional. Thought content does not include homicidal or suicidal ideation. Thought content does not include homicidal or suicidal plan.        Cognition and Memory: Cognition and memory normal.        Judgment: Judgment normal.     Comments: Insight intact     Lab Review:     Component Value Date/Time   NA 141 06/29/2015 2024   K 3.6 06/29/2015 2024   CL 105 06/29/2015 2024   CO2 19 (L) 06/29/2015 1739   GLUCOSE 94 06/29/2015  2024   BUN 8 06/29/2015 2024   CREATININE 0.50 06/29/2015 2024   CALCIUM 8.8 (L) 06/29/2015 1739   PROT 6.4 (L) 06/29/2015 1739   ALBUMIN 3.4 (L) 06/29/2015 1739   AST 25 06/29/2015 1739   ALT 16 06/29/2015 1739   ALKPHOS 37 (L) 06/29/2015 1739   BILITOT 0.5 06/29/2015 1739   GFRNONAA >60 06/29/2015 1739   GFRAA >60 06/29/2015 1739       Component Value Date/Time   WBC 14.7 (H) 12/26/2015 0203   RBC 3.76 (L) 12/26/2015 0203   HGB 12.5 12/26/2015 0203  HCT 34.6 (L) 12/26/2015 0203   PLT 186 12/26/2015 0203   MCV 92.0 12/26/2015 0203   MCH 33.2 12/26/2015 0203   MCHC 36.1 (H) 12/26/2015 0203   RDW 13.1 12/26/2015 0203   LYMPHSABS 3.0 06/29/2015 1739   MONOABS 1.3 (H) 06/29/2015 1739   EOSABS 0.1 06/29/2015 1739   BASOSABS 0.1 06/29/2015 1739    No results found for: POCLITH, LITHIUM   No results found for: PHENYTOIN, PHENOBARB, VALPROATE, CBMZ   .res Assessment: Plan:    Plan:  PDMP reviewed  1. Adderall XR 20mg  twice daily 2. Valium 5mg  BID  BP WNL per patient  Read and reviewed note with patient for accuracy.   RTC 3 months  Patient advised to contact office with any questions, adverse effects, or acute worsening in signs and symptoms.  Discussed potential benefits, risk, and side effects of benzodiazepines to include potential risk of tolerance and dependence, as well as possible drowsiness.  Advised patient not to drive if experiencing drowsiness and to take lowest possible effective dose to minimize risk of dependence and tolerance.  Discussed potential benefits, risks, and side effects of stimulants with patient to include increased heart rate, palpitations, insomnia, increased anxiety, increased irritability, or decreased appetite.  Instructed patient to contact office if experiencing any significant tolerability issues.   Diagnoses and all orders for this visit:  PTSD (post-traumatic stress disorder)  Generalized anxiety disorder -     diazepam  (VALIUM) 5 MG tablet; Take 1 tablet (5 mg total) by mouth 2 (two) times daily.  Attention deficit hyperactivity disorder (ADHD), unspecified ADHD type -     amphetamine-dextroamphetamine (ADDERALL XR) 20 MG 24 hr capsule; Take 1 capsule (20 mg total) by mouth daily. -     amphetamine-dextroamphetamine (ADDERALL XR) 20 MG 24 hr capsule; Take 1 capsule (20 mg total) by mouth 2 (two) times daily. -     amphetamine-dextroamphetamine (ADDERALL XR) 20 MG 24 hr capsule; Take 1 capsule (20 mg total) by mouth 2 (two) times daily.  Major depressive disorder, recurrent episode, moderate (HCC)     Please see After Visit Summary for patient specific instructions.  Future Appointments  Date Time Provider Department Center  04/08/2020  4:00 PM , [facility] None    No orders of the defined types were placed in this encounter.   -------------------------------

## 2020-04-08 ENCOUNTER — Ambulatory Visit: Payer: 59 | Admitting: Psychiatry

## 2020-04-09 ENCOUNTER — Other Ambulatory Visit: Payer: Self-pay | Admitting: Adult Health

## 2020-04-09 ENCOUNTER — Ambulatory Visit (INDEPENDENT_AMBULATORY_CARE_PROVIDER_SITE_OTHER): Payer: 59 | Admitting: Psychiatry

## 2020-04-09 ENCOUNTER — Telehealth: Payer: Self-pay | Admitting: Adult Health

## 2020-04-09 DIAGNOSIS — F4310 Post-traumatic stress disorder, unspecified: Secondary | ICD-10-CM

## 2020-04-09 DIAGNOSIS — F909 Attention-deficit hyperactivity disorder, unspecified type: Secondary | ICD-10-CM

## 2020-04-09 MED ORDER — AMPHETAMINE-DEXTROAMPHET ER 20 MG PO CP24: 20.0000 mg | ORAL_CAPSULE | Freq: Two times a day (BID) | ORAL | 0 refills | Status: DC

## 2020-04-09 NOTE — Telephone Encounter (Signed)
[name] called and said that [facility] needs clarification on instructions for her Adderall 20 mg. The pharmacy said that it shows 1/day but it should be 2 a day. Pharmacy is [place], [phone] and phone # is [phone].
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
## 2020-04-09 NOTE — Telephone Encounter (Signed)
Resent script with correct instructions.

## 2020-04-25 ENCOUNTER — Ambulatory Visit (INDEPENDENT_AMBULATORY_CARE_PROVIDER_SITE_OTHER): Payer: 59 | Admitting: Psychiatry

## 2020-04-25 DIAGNOSIS — F4310 Post-traumatic stress disorder, unspecified: Secondary | ICD-10-CM | POA: Diagnosis not present

## 2020-04-25 NOTE — Progress Notes (Signed)
[facility] Counselor/Therapist Progress Note  Patient ID: Kortnie Stovall, MRN: [id number],    Date: 04/25/2020  Time Spent: 52 minutes start time 1:02 PM end time 1:54 PM Virtual Visit via Telephone Note Connected with patient by a video enabled telemedicine/telehealth application, with their informed consent, and verified patient privacy and that I am speaking with the correct person using two identifiers. I discussed the limitations, risks, security and privacy concerns of performing psychotherapy and management service by telephone and the availability of in person appointments. I also discussed with the patient that there may be a patient responsible charge related to this service. The patient expressed understanding and agreed to proceed. I discussed the treatment planning with the patient. The patient was provided an opportunity to ask questions and all were answered. The patient agreed with the plan and demonstrated an understanding of the instructions. The patient was advised to call  our office if  symptoms worsen or feel they are in a crisis state and need immediate contact.   Therapist Location: office Patient Location: home    Treatment Type: Individual Therapy  Reported Symptoms: sadness, anxiety, triggered responses  Mental Status Exam:  Appearance:   Casual and Neat     Behavior:  Appropriate  Motor:  Normal  Speech/Language:   Normal Rate  Affect:  Appropriate  Mood:  sad  Thought process:  normal  Thought content:    WNL  Sensory/Perceptual disturbances:    WNL  Orientation:  oriented to person, place, time/date and situation  Attention:  Good  Concentration:  Good  Memory:  WNL  Fund of knowledge:   Good  Insight:    Good  Judgment:   Good  Impulse Control:  Good   Risk Assessment: Danger to Self:  No Self-injurious Behavior: No Danger to Others: No Duty to Warn:no Physical Aggression / Violence:No  Access to Firearms a concern: No  Gang  Involvement:No   Subjective: Met with patient via virtual session.  She shared she was worked in for a session due to struggling with sadness. She explained that she had issues at work that was triggering for her.  Did EMDR set up her boss raging on her, suds level 10, negative cognition "everybody hates me" felt sadness in her chest and throat.  Patient was able to reduce his level to 4.  She recognized the fact that her boss felt very similar to her mother.  She shared that she would rage on her and she would never know what she was so angry about.  She shares she had no idea why her boss was so angry with her.  Patient was able to recognize that it was a positive thing because she already has another job and this probably just gave her insight into what it would be like working for her boss which is not healthy for her.  The importance of working on the positives and staying focused towards the future was discussed with patient.  She is going to continue working on her books including forgiving what he cannot forget and decluttering your mind by Dr. Tawanna Solo  Interventions: Solution-Oriented/Positive Psychology and Eye Movement Desensitization and Reprocessing (EMDR)  Diagnosis:   ICD-10-CM   1. PTSD (post-traumatic stress disorder)  F43.10     Plan: Patient is to use CBT and coping skills to decrease triggered responses.  Patient is to continue reading her books- forgiving what you cannot forget and decluttering your mind by Dr. Tawanna Solo.  Patient is also to work on exercising and releasing negative emotions appropriately. Long-term goal: Recall of the traumatic event without becoming overwhelmed with negative emotions Short-term goal: Practice implement relaxation training as a coping mechanism for tension panic stress and anger and anxiety.  [name], [facility]

## 2020-04-30 ENCOUNTER — Ambulatory Visit (INDEPENDENT_AMBULATORY_CARE_PROVIDER_SITE_OTHER): Payer: 59 | Admitting: Psychiatry

## 2020-04-30 DIAGNOSIS — F4310 Post-traumatic stress disorder, unspecified: Secondary | ICD-10-CM | POA: Diagnosis not present

## 2020-04-30 NOTE — Progress Notes (Signed)
[facility] Counselor/Therapist Progress Note  Patient ID: [name], MRN: [id number],    Date: 04/30/2020  Time Spent: 10 minutes start time 9:10 AM end time 10 AM Virtual Visit via Telephone Note Connected with patient by a video enabled telemedicine/telehealth application, with their informed consent, and verified patient privacy and that I am speaking with the correct person using two identifiers. I discussed the limitations, risks, security and privacy concerns of performing psychotherapy and management service by telephone and the availability of in person appointments. I also discussed with the patient that there may be a patient responsible charge related to this service. The patient expressed understanding and agreed to proceed. I discussed the treatment planning with the patient. The patient was provided an opportunity to ask questions and all were answered. The patient agreed with the plan and demonstrated an understanding of the instructions. The patient was advised to call  our office if  symptoms worsen or feel they are in a crisis state and need immediate contact.   Therapist Location: office Patient Location: home    Treatment Type: Individual Therapy  Reported Symptoms: anxiety, triggered responses, sadness  Mental Status Exam:  Appearance:   Casual     Behavior:  Appropriate  Motor:  Normal  Speech/Language:   Normal Rate  Affect:  Appropriate  Mood:  normal  Thought process:  normal  Thought content:    WNL  Sensory/Perceptual disturbances:    WNL  Orientation:  oriented to person, place, time/date and situation  Attention:  Good  Concentration:  Good  Memory:  WNL  Fund of knowledge:   Good  Insight:    Good  Judgment:   Good  Impulse Control:  Good   Risk Assessment: Danger to Self:  No Self-injurious Behavior: No Danger to Others: No Duty to Warn:no Physical Aggression / Violence:No  Access to Firearms a concern: No  Gang Involvement:No    Subjective: Met with patient via virtual session.  Patient reported feeling better after last session concerning what was addressed with her supervisor.  She shared she  Was triggered dropping her son off at school.  Patient was able to recognize that a tone in his teacher's voice was very similar to that of her mothers and brought back lots of memories. Patient reported that she is struggling with the fact that her mother and stepfather are not wanting to have contact with her son.  Patient shared that it is very difficult for her to understand why that would be the case considering he is such as special little boy.  Patient acknowledged that some of the behaviors that they taught her son were inappropriate.  Patient was encouraged to remind herself that her son will be okay even if he does not have a biological grandparent in his life.  She was encouraged to recognize that it would be better for them not to be a part of his life than  to be teaching him things that are inappropriate or to harm him in any way.  Reported it is still hard for her to remember that they need to get out and IVC based on lies that were very hurtful and damaging and made steps to try and get her son taken from her.  Reminded patient that the important thing for her to remember is that she needs to have a healthy relationship with her son and so long as he knows he can talk to her and that she is there and  provides for his needs he will be okay regardless of his grandparents.  Patient explained she is finding it difficult to trust in relationships and notes that is probably tied to her past and not feeling that she has anyone besides her son that she is very close to.  Encouraged patient just to be herself and to take things 1 step at a time and as opportunities arise she will develop a network of support.  Patient agreed to continue working on her self talk and self-care.  Interventions: Cognitive Behavioral Therapy and  Solution-Oriented/Positive Psychology  Diagnosis:   ICD-10-CM   1. PTSD (post-traumatic stress disorder)  F43.10     Plan: Patient is to use CBT and coping skills to decrease triggered responses.  Patient is to continue to remind herself that she is enough and that she and her son will be fine.  Patient is to continue running and exercising to release negative emotions appropriately.  Is to start new job next week. Long-term goal: Recall the traumatic event without becoming overwhelmed with negative emotions Short-term goal: Practice implement relaxation training as a coping mechanism for tension panic stress anger and anxiety  [name], [facility]

## 2020-05-09 ENCOUNTER — Telehealth: Payer: Self-pay | Admitting: Adult Health

## 2020-05-09 ENCOUNTER — Other Ambulatory Visit: Payer: Self-pay | Admitting: Adult Health

## 2020-05-09 DIAGNOSIS — F909 Attention-deficit hyperactivity disorder, unspecified type: Secondary | ICD-10-CM

## 2020-05-09 MED ORDER — AMPHETAMINE-DEXTROAMPHET ER 20 MG PO CP24: 20.0000 mg | ORAL_CAPSULE | Freq: Two times a day (BID) | ORAL | 0 refills | Status: DC

## 2020-05-09 NOTE — Telephone Encounter (Signed)
Pt called stating [facility] in [facility] is closed. Having system issues. She has 2 Rx there for Adderall XR 20 mg 2/d and can not get it. Pt changing Pharmacy to [facility] [address]. [place] # [phone]. Please cancell Rx at [facility] and send to [facility].

## 2020-05-09 NOTE — Telephone Encounter (Signed)
Script sent  

## 2020-05-27 ENCOUNTER — Ambulatory Visit (INDEPENDENT_AMBULATORY_CARE_PROVIDER_SITE_OTHER): Payer: 59 | Admitting: Psychiatry

## 2020-05-27 DIAGNOSIS — F4310 Post-traumatic stress disorder, unspecified: Secondary | ICD-10-CM

## 2020-05-27 NOTE — Progress Notes (Signed)
[facility] Counselor/Therapist Progress Note  Patient ID: [name], MRN: [id number],    Date: 05/27/2020  Time Spent: 50 minutes start time 9:07 AM end time 9:57 AM Virtual Visit via Telephone Note Connected with patient by a video enabled telemedicine/telehealth application, with their informed consent, and verified patient privacy and that I am speaking with the correct person using two identifiers. I discussed the limitations, risks, security and privacy concerns of performing psychotherapy and management service by telephone and the availability of in person appointments. I also discussed with the patient that there may be a patient responsible charge related to this service. The patient expressed understanding and agreed to proceed. I discussed the treatment planning with the patient. The patient was provided an opportunity to ask questions and all were answered. The patient agreed with the plan and demonstrated an understanding of the instructions. The patient was advised to call  our office if  symptoms worsen or feel they are in a crisis state and need immediate contact.   Therapist Location: office Patient Location: home    Treatment Type: Individual Therapy  Reported Symptoms: anxiety, panic, triggered responses, sadness, nightmares, fatigue  Mental Status Exam:  Appearance:   Casual and Neat     Behavior:  Appropriate  Motor:  Normal  Speech/Language:   Normal Rate  Affect:  Appropriate  Mood:  normal  Thought process:  normal  Thought content:    WNL  Sensory/Perceptual disturbances:    WNL  Orientation:  oriented to person, place, time/date and situation  Attention:  Good  Concentration:  Good  Memory:  WNL  Fund of knowledge:   Good  Insight:    Good  Judgment:   Good  Impulse Control:  Good   Risk Assessment: Danger to Self:  No Self-injurious Behavior: No Danger to Others: No Duty to Warn:no Physical Aggression / Violence:No  Access to Firearms a  concern: No  Gang Involvement:No   Subjective: Met with patient via virtual session.  She shared that she really likes her new job and that is going well. She explained she is very happy with that.  She went on to share that her son started wanting to see his grandparents again. Patient shared she went on to have her son drive to parent's house and let him have contact with them again.  She shared she  Was flooded with memories about all that has happened over the past few years with her family.  Patient was encouraged to use that as information to take things slow with reconnecting with her mother and stepfather.  Patient reported it seems to have had a positive effect on her son so she does want to have some contact with them but realizes she has to have boundaries with them.  Discussed ways to have contact and safe environments that we will allow her to set appropriate limits without there being issues.  Patient agreed to try and do things in public and with limited time so she try and keep herself at a safe place.  Encourage patient to recognize the positives that have occurred including her being able to develop her healthy friendship, get a new job, and recognize when she is getting triggered so that she can take appropriate measures to handle the situation.  Interventions: Solution-Oriented/Positive Psychology  Diagnosis:   ICD-10-CM   1. PTSD (post-traumatic stress disorder)  F43.10     Plan: Patient is to use CBT and coping skills to manage triggered  responses.  Patient is to have safe contact with her mother and stepfather establishing healthy boundaries and limits.  Patient is to continue exercising to release negative emotions appropriately. Long-term goal: Recall the traumatic event without becoming overwhelmed with negative emotions Short-term goal: Practice implement relaxation training as a coping mechanism for tension panic stress anger and anxiety   [name],  [facility]

## 2020-06-03 ENCOUNTER — Telehealth: Payer: Self-pay | Admitting: Adult Health

## 2020-06-03 ENCOUNTER — Other Ambulatory Visit: Payer: Self-pay | Admitting: Adult Health

## 2020-06-03 DIAGNOSIS — F909 Attention-deficit hyperactivity disorder, unspecified type: Secondary | ICD-10-CM

## 2020-06-03 MED ORDER — AMPHETAMINE-DEXTROAMPHET ER 20 MG PO CP24: 20.0000 mg | ORAL_CAPSULE | Freq: Two times a day (BID) | ORAL | 0 refills | Status: DC

## 2020-06-03 NOTE — Telephone Encounter (Signed)
Script sent  

## 2020-06-03 NOTE — Telephone Encounter (Signed)
Pt would like a refill on Adderall. Please send to her new pharmacy. [facility] [address] in [place].

## 2020-06-18 ENCOUNTER — Ambulatory Visit (INDEPENDENT_AMBULATORY_CARE_PROVIDER_SITE_OTHER): Payer: 59 | Admitting: Psychiatry

## 2020-06-18 ENCOUNTER — Ambulatory Visit: Payer: 59 | Admitting: Psychiatry

## 2020-06-18 DIAGNOSIS — F4310 Post-traumatic stress disorder, unspecified: Secondary | ICD-10-CM | POA: Diagnosis not present

## 2020-06-18 NOTE — Progress Notes (Signed)
[facility] Counselor/Therapist Progress Note  Patient ID: [name], MRN: [id number],    Date: 06/18/2020  Time Spent: 60 minutes start time 8:03 AM and time 9:03 AM Virtual Visit via Telephone Note Connected with patient by a video enabled telemedicine/telehealth application, with their informed consent, and verified patient privacy and that I am speaking with the correct person using two identifiers. I discussed the limitations, risks, security and privacy concerns of performing psychotherapy and management service by telephone and the availability of in person appointments. I also discussed with the patient that there may be a patient responsible charge related to this service. The patient expressed understanding and agreed to proceed. I discussed the treatment planning with the patient. The patient was provided an opportunity to ask questions and all were answered. The patient agreed with the plan and demonstrated an understanding of the instructions. The patient was advised to call  our office if  symptoms worsen or feel they are in a crisis state and need immediate contact.   Therapist Location: office Patient Location: home     Treatment Type: Individual Therapy  Reported Symptoms: triggered responses, anxiety, agitation, panic, crying spells  Mental Status Exam:  Appearance:   Casual     Behavior:  Appropriate  Motor:  Normal  Speech/Language:   Normal Rate  Affect:  Appropriate tearful  Mood:  anxious  Thought process:  normal  Thought content:    WNL  Sensory/Perceptual disturbances:    WNL  Orientation:  oriented to person, place, time/date and situation  Attention:  Good  Concentration:  Good  Memory:  WNL  Fund of knowledge:   Good  Insight:    Good  Judgment:   Good  Impulse Control:  Good   Risk Assessment: Danger to Self:  No Self-injurious Behavior: No Danger to Others: No Duty to Warn:no Physical Aggression / Violence:No  Access to Firearms a  concern: No  Gang Involvement:No   Subjective: Met with patient via virtual session.  She shared she had a traumatic experience that was triggering for her.  She explained that her son was getting bad notes sent home from school.  She tried to figure out what was going on with her child and had multiple interactions with the director of the daycare.  She shared she was never able to figure out exactly what was going on.  She was able to watch some videos of the classroom 1 day but it did not entail circle time and her child was fine.  She wanted to watch the circle time which seems to be when the notes were coming but they never allowed her to.  Patient shared she got very upset and tried to explain why it was important.  She went on to share that the teacher finally told her that his behavior were normal for the age.  She went on to share that she still really wanted to be able to watch the interactions so she would know what was going on but they did not allow her to do so.  Patient stated things escalated and she and her son were dismissed from the school.  Patient stated it was very upsetting for her and she is not sure what she will do with her son now.  Encouraged her just to take things 1 step at a time and to realize that if she was not being heard then she probably does not need to be at that facility.  Patient was  finally able to realize she felt very triggered and that it was another strong woman not allowing her a chance to say what she needed to say to try an advocate for her son.  Patient reported she had reacted an email in hopes of trying to get back into school.  Patient was encouraged to keep very focused on just the facts in the email and to recognize that they may or may not be able to hear her.  Patient will continue processing the situation at next session.  Interventions: Solution-Oriented/Positive Psychology  Diagnosis:   ICD-10-CM   1. PTSD (post-traumatic stress disorder)  F43.10      Plan: Patient is to use CBT and coping skills to manage triggered responses appropriately.  Patient is to figure out new alternatives for her son for school/daycare.  Patient is to make sure she exercises regularly to release negative emotions appropriately.  Patient is to journal what surfaces for her. Long-term goal: Recall the traumatic event without becoming overwhelmed with negative emotions Short-term goal: Practice implement relaxation training as a coping mechanism for tension panic stress anger and anxiety  [name], [facility]

## 2020-06-20 ENCOUNTER — Ambulatory Visit (INDEPENDENT_AMBULATORY_CARE_PROVIDER_SITE_OTHER): Payer: 59 | Admitting: Psychiatry

## 2020-06-20 DIAGNOSIS — F4310 Post-traumatic stress disorder, unspecified: Secondary | ICD-10-CM | POA: Diagnosis not present

## 2020-06-20 NOTE — Progress Notes (Signed)
[facility] Counselor/Therapist Progress Note  Patient ID: [name], MRN: [id number],    Date: 06/20/2020  Time Spent: 47 minutes start time 12:11 PM end time 12:58 PM Virtual Visit via Telephone Note Connected with patient by a video enabled telemedicine/telehealth application, with their informed consent, and verified patient privacy and that I am speaking with the correct person using two identifiers. I discussed the limitations, risks, security and privacy concerns of performing psychotherapy and management service by telephone and the availability of in person appointments. I also discussed with the patient that there may be a patient responsible charge related to this service. The patient expressed understanding and agreed to proceed. I discussed the treatment planning with the patient. The patient was provided an opportunity to ask questions and all were answered. The patient agreed with the plan and demonstrated an understanding of the instructions. The patient was advised to call  our office if  symptoms worsen or feel they are in a crisis state and need immediate contact.   Therapist Location: office Patient Location: home    Treatment Type: Individual Therapy  Reported Symptoms: anxiety, sadness, triggered responses, tearful  Mental Status Exam:  Appearance:   Well Groomed     Behavior:  Appropriate  Motor:  Normal  Speech/Language:   Normal Rate  Affect:  Appropriate tearful  Mood:  anxious and sad  Thought process:  normal  Thought content:    WNL  Sensory/Perceptual disturbances:    WNL  Orientation:  oriented to person, place and time/date  Attention:  Good  Concentration:  Good  Memory:  WNL  Fund of knowledge:   Good  Insight:    Good  Judgment:   Good  Impulse Control:  Good   Risk Assessment: Danger to Self:  No Self-injurious Behavior: No Danger to Others: No Duty to Warn:no Physical Aggression / Violence:No  Access to Firearms a concern: No   Gang Involvement:No   Subjective: Met with patient via virtual session. She was worked in for an emergency session.  She shared that after her son was dismissed from his school her neighbor that she was close to passed away.  She sated that she is physically feeling sick.  Patient did EMDR set on child being dismissed from school, suds level 10, negative cognition "I am the problem" felt that sadness in her throat and chest.  Patient was able to reduce suds level to 4.  She was able to realize that being dismissed from the school may not be the worst thing.  She realized that the school had issues and she had tried not to pay attention to the red flags due to being determined that the school her son will go to all the way until 12th grade.  As patient discussed other options she was able to recognize that she may not even want to live in this area the whole time her son is in school so now may be a good time for her to be exploring other options.  Patient agreed to start focusing on what she can do currently and letting go of the situation that occurred at her son's school.  Interventions: Solution-Oriented/Positive Psychology and Eye Movement Desensitization and Reprocessing (EMDR)  Diagnosis:   ICD-10-CM   1. PTSD (post-traumatic stress disorder)  F43.10     Plan: Patient is to use CBT and coping skills to decrease triggered responses.  Patient is to start exploring other options for her son and for herself concerning  their future.  Patient is to work on running to release negative emotions appropriately.  Patient is to use her self talk to think through positives that can come out of a negative situation. Long-term goal: Recall the traumatic event without becoming overwhelming negative emotions Short-term goal: Practice implement relaxation training as a coping mechanism for tension panic stress anger and anxiety.  [name], [facility]

## 2020-06-25 ENCOUNTER — Ambulatory Visit (INDEPENDENT_AMBULATORY_CARE_PROVIDER_SITE_OTHER): Payer: 59 | Admitting: Psychiatry

## 2020-06-25 ENCOUNTER — Other Ambulatory Visit: Payer: Self-pay

## 2020-06-25 ENCOUNTER — Ambulatory Visit: Payer: 59 | Admitting: Psychiatry

## 2020-06-25 DIAGNOSIS — F4310 Post-traumatic stress disorder, unspecified: Secondary | ICD-10-CM

## 2020-06-25 NOTE — Progress Notes (Signed)
[facility] Counselor/Therapist Progress Note  Patient ID: [name], MRN: [id number],    Date: 06/25/2020  Time Spent: 52 minutes start time 1:05 PM end time 1:57 PM Virtual Visit via Telephone Note Connected with patient by a video enabled telemedicine/telehealth application, with their informed consent, and verified patient privacy and that I am speaking with the correct person using two identifiers. I discussed the limitations, risks, security and privacy concerns of performing psychotherapy and management service by telephone and the availability of in person appointments. I also discussed with the patient that there may be a patient responsible charge related to this service. The patient expressed understanding and agreed to proceed. I discussed the treatment planning with the patient. The patient was provided an opportunity to ask questions and all were answered. The patient agreed with the plan and demonstrated an understanding of the instructions. The patient was advised to call  our office if  symptoms worsen or feel they are in a crisis state and need immediate contact.   Therapist Location: office Patient Location: home    Treatment Type: Individual Therapy  Reported Symptoms: anxiety, sadness, anger, crying spells, triggered responses  Mental Status Exam:  Appearance:   Well Groomed     Behavior:  Appropriate  Motor:  Normal  Speech/Language:   Normal Rate  Affect:  Appropriate  Mood:  anxious and sad  Thought process:  normal  Thought content:    WNL  Sensory/Perceptual disturbances:    WNL  Orientation:  oriented to person, place, time/date and situation  Attention:  Good  Concentration:  Good  Memory:  WNL  Fund of knowledge:   Good  Insight:    Good  Judgment:   Good  Impulse Control:  Good   Risk Assessment: Danger to Self:  No Self-injurious Behavior: No Danger to Others: No Duty to Warn:no Physical Aggression / Violence:No  Access to Firearms a  concern: No  Gang Involvement:No   Subjective: Met with patient via virtual session.  She shared she is still having difficulty with her son being kicked out of the school.  She explained she is still wanting to understand why it went that poorly. She was encouraged to figure out what she is hoping will happen by continuing to interact with them. She has been able to find a new place for her son to be able to attend.  Patient shared that she is also having issues with her neighbor who is upset with the fact that she has gotten another dog.  She shared there is been lots of issues and interactions with her which had not happened prior to this situation.  Patient also shared there was an incident at work where she was having anxiety over something that her supervisor had told her and she went to the person who would hired her to discuss it and was able to resolve it but her supervisor was upset because she had talked about it the issue.  Patient was encouraged to recognize that the pattern in the different situations seems to be about control and power.  Patient was able to acknowledge she is never felt that she is had control.  Encourage patient to recognize when she is getting triggered and to take deep breaths and to try get her self grounded before she responds especially to a female when she is triggered.  Encourage patient to recognize that she does not have to engage in battles for power and that she can handle  things differently.  Patient was able to think through different situations that she is handled appropriately and recognize that when she is able to not take something personally it goes better.  CBT filters to help her with that were discussed with patient.  Interventions: Cognitive Behavioral Therapy and Solution-Oriented/Positive Psychology  Diagnosis:   ICD-10-CM   1. PTSD (post-traumatic stress disorder)  F43.10     Plan: Patient is to use CBT and coping skills to decrease triggered  responses.  Patient is to focus on recognizing when she is triggered and ground herself before responding especially to a female.  Patient is to work on letting go of the issue with the daycare since hanging onto it is only going to harm her and trigger her.  Patient is to continue running to release negative emotions appropriately. Long-term goal: Recall the traumatic event without becoming overwhelming negative emotions Short-term goal: Practice implement relaxation training as a coping mechanism for tension panic stress anger and anxiety  [name], [facility]

## 2020-07-01 ENCOUNTER — Ambulatory Visit (INDEPENDENT_AMBULATORY_CARE_PROVIDER_SITE_OTHER): Payer: 59 | Admitting: Psychiatry

## 2020-07-01 DIAGNOSIS — F4310 Post-traumatic stress disorder, unspecified: Secondary | ICD-10-CM
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
## 2020-07-03 ENCOUNTER — Telehealth: Payer: Self-pay | Admitting: Adult Health

## 2020-07-03 ENCOUNTER — Other Ambulatory Visit: Payer: Self-pay | Admitting: Adult Health

## 2020-07-03 DIAGNOSIS — F909 Attention-deficit hyperactivity disorder, unspecified type: Secondary | ICD-10-CM

## 2020-07-03 DIAGNOSIS — F411 Generalized anxiety disorder: Secondary | ICD-10-CM

## 2020-07-03 MED ORDER — AMPHETAMINE-DEXTROAMPHET ER 20 MG PO CP24: 20.0000 mg | ORAL_CAPSULE | Freq: Two times a day (BID) | ORAL | 0 refills | Status: DC

## 2020-07-03 MED ORDER — DIAZEPAM 5 MG PO TABS: 5.0000 mg | ORAL_TABLET | Freq: Two times a day (BID) | ORAL | 2 refills | Status: DC

## 2020-07-03 NOTE — Telephone Encounter (Signed)
Scripts sent

## 2020-07-03 NOTE — Telephone Encounter (Signed)
Pt called and said that she needs a refill on her adderall xr 20 mg and her valium 5 mg to be sent to the [facility] in [place]

## 2020-07-19 ENCOUNTER — Telehealth: Payer: Self-pay | Admitting: Adult Health

## 2020-07-19 ENCOUNTER — Telehealth (INDEPENDENT_AMBULATORY_CARE_PROVIDER_SITE_OTHER): Payer: 59 | Admitting: Adult Health

## 2020-07-19 ENCOUNTER — Encounter: Payer: Self-pay | Admitting: Adult Health

## 2020-07-19 DIAGNOSIS — F331 Major depressive disorder, recurrent, moderate: Secondary | ICD-10-CM | POA: Diagnosis not present

## 2020-07-19 DIAGNOSIS — F411 Generalized anxiety disorder: Secondary | ICD-10-CM | POA: Diagnosis not present

## 2020-07-19 DIAGNOSIS — F4310 Post-traumatic stress disorder, unspecified: Secondary | ICD-10-CM

## 2020-07-19 DIAGNOSIS — F909 Attention-deficit hyperactivity disorder, unspecified type: Secondary | ICD-10-CM

## 2020-07-19 MED ORDER — DIAZEPAM 5 MG PO TABS: 5.0000 mg | ORAL_TABLET | Freq: Two times a day (BID) | ORAL | 2 refills | Status: DC

## 2020-07-19 MED ORDER — AMPHETAMINE-DEXTROAMPHET ER 20 MG PO CP24: 20.0000 mg | ORAL_CAPSULE | Freq: Two times a day (BID) | ORAL | 0 refills | Status: DC

## 2020-07-19 NOTE — Telephone Encounter (Signed)
Ms. xara, paulding are scheduled for a virtual visit with your provider today.    Just as we do with appointments in the office, we must obtain your consent to participate.  Your consent will be active for this visit and any virtual visit you may have with one of our providers in the next 365 days.    If you have a MyChart account, I can also send a copy of this consent to you electronically.  All virtual visits are billed to your insurance company just like a traditional visit in the office.  As this is a virtual visit, video technology does not allow for your provider to perform a traditional examination.  This may limit your provider's ability to fully assess your condition.  If your provider identifies any concerns that need to be evaluated in person or the need to arrange testing such as labs, EKG, etc, we will make arrangements to do so.    Although advances in technology are sophisticated, we cannot ensure that it will always work on either your end or our end.  If the connection with a video visit is poor, we may have to switch to a telephone visit.  With either a video or telephone visit, we are not always able to ensure that we have a secure connection.   I need to obtain your verbal consent now.   Are you willing to proceed with your visit today?   [name] has provided verbal consent on 07/19/2020 for a virtual visit (video or telephone).   [name], NP 07/19/2020  8:11 AM
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
## 2020-08-06 ENCOUNTER — Ambulatory Visit (INDEPENDENT_AMBULATORY_CARE_PROVIDER_SITE_OTHER): Payer: 59 | Admitting: Psychiatry

## 2020-08-06 DIAGNOSIS — F4310 Post-traumatic stress disorder, unspecified: Secondary | ICD-10-CM | POA: Diagnosis not present

## 2020-08-06 NOTE — Progress Notes (Signed)
    [facility] Counselor/Therapist Progress Note  Patient ID: [name], MRN: [id number],    Date: 08/06/2020  Time Spent: 49 minutes start time 12:11 PM Virtual Visit via Telephone Note Connected with patient by a video enabled telemedicine/telehealth application, with their informed consent, and verified patient privacy and that I am speaking with the correct person using two identifiers. I discussed the limitations, risks, security and privacy concerns of performing psychotherapy and management service by telephone and the availability of in person appointments. I also discussed with the patient that there may be a patient responsible charge related to this service. The patient expressed understanding and agreed to proceed. I discussed the treatment planning with the patient. The patient was provided an opportunity to ask questions and all were answered. The patient agreed with the plan and demonstrated an understanding of the instructions. The patient was advised to call  our office if  symptoms worsen or feel they are in a crisis state and need immediate contact.   Therapist Location: office Patient Location: home    Treatment Type: Individual Therapy  Reported Symptoms: anxiety, frustration, triggered responses, physical symptoms  Mental Status Exam:  Appearance:   Casual and Neat     Behavior:  Appropriate  Motor:  Normal  Speech/Language:   Normal Rate  Affect:  Appropriate  Mood:  anxious  Thought process:  normal  Thought content:    WNL  Sensory/Perceptual disturbances:    WNL  Orientation:  oriented to person, place, time/date and situation  Attention:  Good  Concentration:  Good  Memory:  WNL  Fund of knowledge:   Good  Insight:    Good  Judgment:   Good  Impulse Control:  Good   Risk Assessment: Danger to Self:  No Self-injurious Behavior: No Danger to Others: No Duty to Warn:no Physical Aggression / Violence:No  Access to Firearms a concern: No  Gang  Involvement:No   Subjective: Met with patient via virtual session.  She shared that there was a lot going on with her currently.  She shared that his son has gotten into a new school in May.  She shared that the work dynamics are difficult and her contract was not going to be funded any longer.  She shared she has started interviewing for another position.  Patient stated she started talking with her mother again but it led to another conflict.  She went on to share that her mother finally told her that she has borderline personality disorder.  Discussed what that meant for patient and the importance of her recognizing that things will always be difficult with her mother but that does not mean that her mother does not love her it just means that her mother is doing the best she can to deal with the pain from her past.  Patient shared that knowing the diagnosis has been very helpful for her.  Patient was able to think of ways that she can continue on her healing with the relationship as discussion continued.  Patient also shared the dynamics within her job and how she is recognizing patterns in her relationships.  Ways for her to handle things differently and be able to pursue other options were discussed with patient.  Patient also shared that she has been able to find a positive school situation for her son and that has helped her to realize that she had gotten fixated on things having to work out with the old place when she just needed to   move forward with life.  The importance of taking that information and being able to plot to future situations were discussed with patient.  Interventions: Cognitive Behavioral Therapy and Solution-Oriented/Positive Psychology  Diagnosis:   ICD-10-CM   1. PTSD (post-traumatic stress disorder)  F43.10     Plan: Patient is to use CBT and coping skills to decrease triggered responses.  Patient is to follow plans from session to interact differently with her mother and her  work situation.  Patient is to continue exercising to release negative emotions appropriately.  Patient is to take medication as directed.  Patient is to continue working on her affirmations and self-care. Long-term goal: Recall the traumatic event without becoming overwhelmed with negative emotions Short-term goal: Practice implement relaxation training as a coping mechanism for tension panic stress anger and anxiety  Holly Ingram, LCMHC                   

## 2020-08-21 ENCOUNTER — Ambulatory Visit (INDEPENDENT_AMBULATORY_CARE_PROVIDER_SITE_OTHER): Payer: 59 | Admitting: Psychiatry

## 2020-08-21 DIAGNOSIS — F4310 Post-traumatic stress disorder, unspecified: Secondary | ICD-10-CM

## 2020-08-21 NOTE — Progress Notes (Signed)
[facility] Counselor/Therapist Progress Note  Patient ID: [name], MRN: [id number],    Date: 08/21/2020  Time Spent: 52 minutes start time 8:58 AM end time 9:50 AM Virtual Visit via Telephone Note Connected with patient by a video enabled telemedicine/telehealth application, with their informed consent, and verified patient privacy and that I am speaking with the correct person using two identifiers. I discussed the limitations, risks, security and privacy concerns of performing psychotherapy and management service by telephone and the availability of in person appointments. I also discussed with the patient that there may be a patient responsible charge related to this service. The patient expressed understanding and agreed to proceed. I discussed the treatment planning with the patient. The patient was provided an opportunity to ask questions and all were answered. The patient agreed with the plan and demonstrated an understanding of the instructions. The patient was advised to call  our office if  symptoms worsen or feel they are in a crisis state and need immediate contact.   Therapist Location: office Patient Location: home     Treatment Type: Individual Therapy  Reported Symptoms: anxiety, triggered responses, fatigue, sadness, frustration  Mental Status Exam:  Appearance:   Casual     Behavior:  Appropriate  Motor:  Normal  Speech/Language:   Normal Rate  Affect:  Appropriate  Mood:  sad  Thought process:  normal  Thought content:    WNL  Sensory/Perceptual disturbances:    WNL  Orientation:  oriented to person, place, time/date and situation  Attention:  Good  Concentration:  Good  Memory:  WNL  Fund of knowledge:   Good  Insight:    Good  Judgment:   Good  Impulse Control:  Good   Risk Assessment: Danger to Self:  No Self-injurious Behavior: No Danger to Others: No Duty to Warn:no Physical Aggression / Violence:No  Access to Firearms a concern: No   Gang Involvement:No   Subjective: Met with patient via virtual session.  She shared that the new job is positive.  She went on to share that the issue is her mother has triggered her again.  Mother stated that she told her how sorry she went through with the hospital, and they tried to put all the blame on the allegations on her sister.  Patient stated she was able to get them to state the truth after a while. She also reported that her mother has told her she was diagnosed as borderline personality disorder. She has tried to reconcile with her sister but it has been difficult. Encouraged patient to realize she has to recognize that her mother and sister are who they are and that they are not going to change.  She was encouraged to try and recognize when she is getting triggered and she needs to disconnect from her mother and sister.  She also shared that her son put his hands to his throat and acted like he was dying. Patient was able to share she saw her mother turn blue when she she was a little girl when her mother was was trying to choke herself. Discussed ways for her to talk to him when he has those behaviors to help him recognize that he is angry and that is not the proper way to express his anger.  Patient reported feeling better when she had some ideas on how to handle the situation appropriately.  Patient acknowledged that she gets triggered and feels overwhelmed and can get tearful in those  moments.  Encourage patient to work on that trauma at next session through EMDR.     Interventions: Solution-Oriented/Positive Psychology  Diagnosis:   ICD-10-CM   1. PTSD (post-traumatic stress disorder)  F43.10     Plan: Patient is to use CBT and coping skills to decrease triggered responses.  Patient is to continue exercising to release negative emotions appropriately.  Patient is to disengage from her sister and mother when she feels that they are triggering her.  Patient is to follow plans to  communicate with her son on how to appropriately express his frustrations in the future.  Patient is to work on reprocessing traumas at next session through EMDR. Long-term goal: Recall the traumatic event without becoming overwhelmed with negative emotions Short-term goal: Practice implement relaxation training as a coping mechanism for tension panic stress anger and anxiety  [name], [facility]

## 2020-08-26 ENCOUNTER — Ambulatory Visit (INDEPENDENT_AMBULATORY_CARE_PROVIDER_SITE_OTHER): Payer: 59 | Admitting: Psychiatry

## 2020-08-26 DIAGNOSIS — F4310 Post-traumatic stress disorder, unspecified: Secondary | ICD-10-CM | POA: Diagnosis not present

## 2020-08-26 NOTE — Progress Notes (Signed)
[facility] Counselor/Therapist Progress Note  Patient ID: [name], MRN: [id number],    Date: 08/26/2020  Time Spent: 52 minutes start time 10:06 AM end time 10:58 AM Virtual Visit via Telephone Note Connected with patient by a video enabled telemedicine/telehealth application, with their informed consent, and verified patient privacy and that I am speaking with the correct person using two identifiers. I discussed the limitations, risks, security and privacy concerns of performing psychotherapy and management service by telephone and the availability of in person appointments. I also discussed with the patient that there may be a patient responsible charge related to this service. The patient expressed understanding and agreed to proceed. I discussed the treatment planning with the patient. The patient was provided an opportunity to ask questions and all were answered. The patient agreed with the plan and demonstrated an understanding of the instructions. The patient was advised to call  our office if  symptoms worsen or feel they are in a crisis state and need immediate contact.   Therapist Location: office Patient Location: home    Treatment Type: Individual Therapy  Reported Symptoms: anxiety, triggered responses,  Mental Status Exam:  Appearance:   Casual     Behavior:  Appropriate  Motor:  Normal  Speech/Language:   Normal Rate  Affect:  Appropriate  Mood:  normal  Thought process:  normal  Thought content:    WNL  Sensory/Perceptual disturbances:    WNL  Orientation:  oriented to person, place, time/date and situation  Attention:  Good  Concentration:  Good  Memory:  WNL  Fund of knowledge:   Good  Insight:    Good  Judgment:   Good  Impulse Control:  Good   Risk Assessment: Danger to Self:  No Self-injurious Behavior: No Danger to Others: No Duty to Warn:no Physical Aggression / Violence:No  Access to Firearms a concern: No  Gang Involvement:No    Subjective: Met with patient via virtual session.  She shared that she reconnected with her sister over the weekend.  Patient shared that she was able to interact differently with her than she has in the past and she shared that it was very freeing for her.  She stated she that it was great to see how the EMDR had helped her heal.She had an incident with her neighbor when she was trying to fix her car. She stated it has lead to other interactions with her which has been difficult.   She shared that she was able handle things with that situation well also which showed progress.  She is realizing she has to let her mother be in control and be happy with her, patient shared she is getting less reactive to her.Patient reported that work is going well and she is having lots of recruiters contacting her.She has made a vision board to try and give her direction.  Encouraged patient to continue working on the positive steps she is making and to remind herself regularly of her progress.  Interventions: Solution-Oriented/Positive Psychology  Diagnosis:   ICD-10-CM   1. PTSD (post-traumatic stress disorder)  F43.10     Plan: Patient is to use CBT and coping skills to decrease triggered responses.  Patient is to continue exercising to release negative emotions appropriately.  Patient is to remind herself regularly of the positive steps that she is made towards dealing with her trauma.  Patient is to continue setting appropriate limits with her mother and sister while trying to maintain a relationship.  Long-term goal: Recall the traumatic event without becoming overwhelmed with negative emotions Short-term goal: Practice implement relaxation training as a coping mechanism for tension panic stress anger and anxiety  Holly Ingram, LCMHC                  

## 2020-09-09 ENCOUNTER — Ambulatory Visit (INDEPENDENT_AMBULATORY_CARE_PROVIDER_SITE_OTHER): Payer: 59 | Admitting: Psychiatry

## 2020-09-09 ENCOUNTER — Emergency Department (INDEPENDENT_AMBULATORY_CARE_PROVIDER_SITE_OTHER)
Admission: RE | Admit: 2020-09-09 | Discharge: 2020-09-09 | Disposition: A | Discharge status: Home / Self Care | Payer: 59 | Source: Ambulatory Visit

## 2020-09-09 ENCOUNTER — Other Ambulatory Visit: Payer: Self-pay

## 2020-09-09 ENCOUNTER — Telehealth: Payer: Self-pay | Admitting: Emergency Medicine

## 2020-09-09 ENCOUNTER — Emergency Department
Admission: EM | Admit: 2020-09-09 | Discharge: 2020-09-09 | Discharge status: Absent without leave | Payer: 59 | Source: Home / Self Care

## 2020-09-09 ENCOUNTER — Emergency Department
Admit: 2020-09-09 | Discharge status: Absent without leave | Payer: No Typology Code available for payment source | Source: Home / Self Care

## 2020-09-09 VITALS — BP 117/80 | HR 104 | Temp 98.9°F

## 2020-09-09 DIAGNOSIS — J309 Allergic rhinitis, unspecified: Secondary | ICD-10-CM | POA: Diagnosis not present

## 2020-09-09 DIAGNOSIS — J0100 Acute maxillary sinusitis, unspecified: Secondary | ICD-10-CM | POA: Diagnosis not present

## 2020-09-09 DIAGNOSIS — F4310 Post-traumatic stress disorder, unspecified: Secondary | ICD-10-CM

## 2020-09-09 DIAGNOSIS — H6692 Otitis media, unspecified, left ear: Secondary | ICD-10-CM

## 2020-09-09 MED ORDER — FEXOFENADINE HCL 180 MG PO TABS: 180.0000 mg | ORAL_TABLET | Freq: Every day | ORAL | 0 refills | Status: DC

## 2020-09-09 MED ORDER — AMOXICILLIN-POT CLAVULANATE 875-125 MG PO TABS: 1.0000 | ORAL_TABLET | Freq: Two times a day (BID) | ORAL | 0 refills | Status: DC

## 2020-09-09 MED ORDER — FEXOFENADINE HCL 60 MG PO TABS: 180.0000 mg | ORAL_TABLET | Freq: Every day | ORAL | 0 refills | Status: DC

## 2020-09-09 NOTE — ED Provider Notes (Signed)
[name]    CSN: [id number] Arrival date & time: 09/09/20  1743      History   Chief Complaint Chief Complaint  Patient presents with  . Appointment    Cough    HPI [name] is a 35 y.o. female.   HPI 35 year-old-female presents with scratchy throat, productive cough, congestion and fatigue for 2 days.  Past Medical History:  Diagnosis Date  . ADHD (attention deficit hyperactivity disorder)   . Anxiety   . Depression   . Headache   . Hx of varicella     Patient Active Problem List   Diagnosis Date Noted  . PTSD (post-traumatic stress disorder) 08/23/2019  . Aggressive behavior 08/23/2019  . Involuntary commitment 08/23/2019  . Adjustment disorder with mixed anxiety and depressed mood 07/26/2019  . ADHD (attention deficit hyperactivity disorder), combined type 07/15/2017  . Vaginal delivery 12/28/2015  . Anxiety 12/25/2015  . Depression 12/25/2015  . Positive GBS test 12/25/2015  . Post-dates pregnancy 12/25/2015  . Smoker 06/30/2015  . Hx of ovarian cyst 06/30/2015  . History of ADHD 06/30/2015  . Bilateral sensorineural hearing loss 12/13/2012  . Otalgia 12/13/2012  . Tinnitus 12/13/2012  . 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 NO PAST SURGERIES      OB History    Gravida  2   Para  1   Term  1   Preterm      AB  1   Living  1     SAB      IAB      Ectopic      Multiple  0   Live Births  1            Home Medications    Prior to Admission medications   Medication Sig Start Date End Date Taking? Authorizing Provider  amoxicillin-clavulanate (AUGMENTIN) 875-125 MG tablet Take 1 tablet by mouth every 12 (twelve) hours. 09/09/20  Yes Trevor Iha, FNP  amphetamine-dextroamphetamine (ADDERALL XR) 20 MG 24 hr capsule Take 1 capsule (20 mg total) by mouth 2 (two) times daily. 07/19/20  Yes Mozingo, Thereasa Solo, NP  amphetamine-dextroamphetamine (ADDERALL XR) 20 MG 24  hr capsule Take 1 capsule (20 mg total) by mouth 2 (two) times daily. 08/16/20  Yes Mozingo, Thereasa Solo, NP  amphetamine-dextroamphetamine (ADDERALL XR) 20 MG 24 hr capsule Take 1 capsule (20 mg total) by mouth 2 (two) times daily. 09/13/20  Yes Mozingo, Thereasa Solo, NP  calcium carbonate (TUMS - DOSED IN MG ELEMENTAL CALCIUM) 500 MG chewable tablet Chew 1-2 tablets by mouth 3 (three) times daily as needed for indigestion or heartburn.   Yes [provider]  diazepam (VALIUM) 10 MG tablet SMARTSIG:1 Tablet(s) By Mouth 12/14/19  Yes [provider]  diazepam (VALIUM) 5 MG tablet Take 1 tablet (5 mg total) by mouth 2 (two) times daily. 07/19/20  Yes Mozingo, Thereasa Solo, NP  fexofenadine (ALLEGRA ALLERGY) 180 MG tablet Take 1 tablet (180 mg total) by mouth daily for 15 days. 09/09/20 09/24/20 Yes Trevor Iha, FNP  gabapentin (NEURONTIN) 300 MG capsule Take 300-600 mg by mouth 3 (three) times daily. 02/01/20  Yes [provider]  ibuprofen (ADVIL,MOTRIN) 600 MG tablet Take 1 tablet (600 mg total) by mouth every 6 (six) hours as needed. 12/29/15  Yes Nigel Bridgeman, CNM  Omega-3 Fatty Acids (FISH OIL) 500 MG CAPS Take 1 capsule by mouth daily.   Yes [provider]  polycarbophil (FIBERCON) 625 MG tablet Take 1,875 mg by mouth as needed for mild constipation.   Yes [provider]  HYDROcodone-acetaminophen (NORCO/VICODIN) 5-325 MG tablet Take one by mouth at bedtime as needed for pain 04/17/19   Lattie Haw, MD  predniSONE (DELTASONE) 20 MG tablet Take one tab by mouth twice daily for 4 days, then one daily. Take with food. 04/17/19   Lattie Haw, MD  Prenatal Vit-Fe Fumarate-FA (PRENATAL MULTIVITAMIN) TABS tablet Take 1 tablet by mouth daily at 12 noon.    [provider]    Family History Family History  Problem Relation Age of Onset  . Stroke Maternal Grandmother   . Anxiety disorder Maternal Grandmother   . Cancer Maternal  Grandfather   . Stroke Maternal Grandfather   . ADD / ADHD Mother   . Alcoholism Father   . Bipolar disorder Sister     Social History Social History   Tobacco Use  . Smoking status: Former Smoker    Quit date: 06/25/2015    Years since quitting: 5.2  . Smokeless tobacco: Never Used  Vaping Use  . Vaping Use: Never used  Substance Use Topics  . Alcohol use: No  . Drug use: No     Allergies   Patient has no known allergies.   Review of Systems Review of Systems  Constitutional: Positive for fatigue.  HENT: Positive for congestion and sore throat.   Eyes: Negative.   Respiratory: Positive for cough.   Cardiovascular: Negative.   Gastrointestinal: Negative.   Genitourinary: Negative.   Musculoskeletal: Negative.   Skin: Negative.   Neurological: Negative.      Physical Exam Triage Vital Signs ED Triage Vitals  Enc Vitals Group     BP 09/09/20 1849 117/80     Pulse Rate 09/09/20 1849 (!) 104     Resp --      Temp 09/09/20 1849 98.9 F (37.2 C)     Temp Source 09/09/20 1849 Oral     SpO2 09/09/20 1849 99 %     Weight --      Height --      Head Circumference --      Peak Flow --      Pain Score 09/09/20 1850 4     Pain Loc --      Pain Edu? --      Excl. in GC? --    No data found.  Updated Vital Signs BP 117/80 (BP Location: Left Arm)   Pulse (!) 104   Temp 98.9 F (37.2 C) (Oral)   SpO2 99%    Physical Exam Vitals and nursing note reviewed.  Constitutional:      General: She is not in acute distress.    Appearance: Normal appearance. She is ill-appearing.  HENT:     Head: Normocephalic and atraumatic.     Right Ear: Tympanic membrane is retracted.     Left Ear: Tympanic membrane is erythematous and bulging.     Nose:     Right Turbinates: Enlarged.     Left Turbinates: Enlarged.     Right Sinus: Maxillary sinus tenderness present.     Left Sinus: Maxillary sinus tenderness present.     Comments: Turbinates are erythematous     Mouth/Throat:     Lips: Pink.     Mouth: Mucous membranes are moist.     Pharynx: Oropharynx is clear. Uvula midline. No pharyngeal swelling, posterior oropharyngeal erythema or uvula swelling.  Comments: Moderate clear drainage of posterior oropharynx noted Cardiovascular:     Rate and Rhythm: Normal rate and regular rhythm.     Pulses: Normal pulses.     Heart sounds: Normal heart sounds.  Pulmonary:     Effort: Pulmonary effort is normal.     Breath sounds: Normal breath sounds.     Comments: No adventitious breath sounds noted Musculoskeletal:        General: Normal range of motion.     Cervical back: Normal range of motion and neck supple.  Lymphadenopathy:     Cervical: Cervical adenopathy present.  Skin:    General: Skin is warm and dry.  Neurological:     General: No focal deficit present.     Mental Status: She is alert and oriented to person, place, and time.  Psychiatric:        Mood and Affect: Mood normal.        Behavior: Behavior normal.      UC Treatments / Results  Labs (all labs ordered are listed, but only abnormal results are displayed) Labs Reviewed - No data to display  EKG   Radiology No results found.  Procedures Procedures (including critical care time)  Medications Ordered in UC Medications - No data to display  Initial Impression / Assessment and Plan / UC Course  I have reviewed the triage vital signs and the nursing notes.  Pertinent labs & imaging results that were available during my care of the patient were reviewed by me and considered in my medical decision making (see chart for details).     MDM: 1.  Left acute otitis media, 2.  Subacute maxillary sinusitis, 3.  Allergic rhinitis.  Patient discharged home, hemodynamically stable Final Clinical Impressions(s) / UC Diagnoses   Final diagnoses:  Acute left otitis media  Subacute maxillary sinusitis  Allergic rhinitis, unspecified seasonality, unspecified trigger      Discharge Instructions     Advised/instructed patient to take medication as directed with food to completion.  Advised patient to take Allegra 180 mg for the next 5 days, then as needed.  Advised patient increase daily water intake while taking these medications.    ED Prescriptions    Medication Sig Dispense Auth. Provider   amoxicillin-clavulanate (AUGMENTIN) 875-125 MG tablet Take 1 tablet by mouth every 12 (twelve) hours. 14 tablet Trevor Iha, FNP   fexofenadine [facility] ALLERGY) 180 MG tablet Take 1 tablet (180 mg total) by mouth daily for 15 days. 15 tablet Trevor Iha, FNP     PDMP not reviewed this encounter.   Trevor Iha, FNP 09/09/20 1946

## 2020-09-09 NOTE — Discharge Instructions (Addendum)
Advised/instructed patient to take medication as directed with food to completion.  Advised patient to take Allegra 180 mg for the next 5 days, then as needed.  Advised patient increase daily water intake while taking these medications.

## 2020-09-09 NOTE — ED Triage Notes (Signed)
Patient c/o scratchy throat x 2 days, productive cough, congestion, fatigued, just feeling bad.  Patient has taken Sudafed.  Patient is vaccinated.

## 2020-09-09 NOTE — Telephone Encounter (Signed)
Current pharmacy closed -[facility] open until 2100- scripts [facility] to [facility]- [facility] canceled

## 2020-09-09 NOTE — Progress Notes (Signed)
[facility] Counselor/Therapist Progress Note  Patient ID: [name], MRN: [id number],    Date: 09/09/2020  Time Spent: 51 minutes start time 3:07 PM end time 3:58 PM Virtual Visit via Telephone Note Connected with patient by a video enabled telemedicine/telehealth application, with their informed consent, and verified patient privacy and that I am speaking with the correct person using two identifiers. I discussed the limitations, risks, security and privacy concerns of performing psychotherapy and management service by telephone and the availability of in person appointments. I also discussed with the patient that there may be a patient responsible charge related to this service. The patient expressed understanding and agreed to proceed. I discussed the treatment planning with the patient. The patient was provided an opportunity to ask questions and all were answered. The patient agreed with the plan and demonstrated an understanding of the instructions. The patient was advised to call  our office if  symptoms worsen or feel they are in a crisis state and need immediate contact.   Therapist Location: office Patient Location: home    Treatment Type: Individual Therapy  Reported Symptoms: triggered responses, flashbacks, anger, crying spells, anxiety  Mental Status Exam:  Appearance:   Casual     Behavior:  Appropriate  Motor:  Normal  Speech/Language:   Normal Rate  Affect:  Appropriate  Mood:  normal  Thought process:  normal  Thought content:    WNL  Sensory/Perceptual disturbances:    WNL  Orientation:  oriented to person, place, time/date and situation  Attention:  Good  Concentration:  Good  Memory:  WNL  Fund of knowledge:   Good  Insight:    Good  Judgment:   Good  Impulse Control:  Good   Risk Assessment: Danger to Self:  No Self-injurious Behavior: No Danger to Others: No Duty to Warn:no Physical Aggression / Violence:No  Access to Firearms a concern: No   Gang Involvement:No   Subjective: Met with patient via virtual session.  She shared that she was sick and her niece who she was around just tested positive for COVID. Patient shared that her son choked himself again in the car and it looked like her mother and she got very triggered. She was very tearful as she shared how upsetting it was for her.  She also shared that she is still trying to deal with her mother being borderline personality disorder. She wanted to address mom saying she would not keep the dogs in EMDR suds level 10, negative cognition "I do not have anybody" felt unloved and sad in her chest.  Patient was able to reduce suds level to 3.  She was able to recognize this is a huge pattern of her mother when she says she will help and then she never comes through when she needs her to.  She was also able to work through other memories of her with her mom.  Patient also recalled during the incident that her mother had done the choking thing that her son is doing in front of him.  Patient was encouraged to make time with her mother very minimal and to recognize when she is starting to change into negative behavior and remove her son and herself quickly.  Interventions: Solution-Oriented/Positive Psychology and Eye Movement Desensitization and Reprocessing (EMDR)  Diagnosis:   ICD-10-CM   1. PTSD (post-traumatic stress disorder)  F43.10     Plan: Patient is to use CBT and coping skills to decrease negative emotions.  Patient  is to release negative emotions through exercise.  Patient is to limit time with her mother and be prepared to remove she and son as soon as she sees her mother changing into a different mood.   Long-term goal: Recall the traumatic event without becoming overwhelmed with negative emotions Short-term goal: Practice implement relaxation training as a coping mechanism for tension panic stress anger and anxiety  [name], [facility]

## 2020-09-30 ENCOUNTER — Telehealth: Payer: Self-pay | Admitting: Adult Health

## 2020-09-30 ENCOUNTER — Other Ambulatory Visit: Payer: Self-pay

## 2020-09-30 DIAGNOSIS — F909 Attention-deficit hyperactivity disorder, unspecified type: Secondary | ICD-10-CM

## 2020-09-30 MED ORDER — AMPHETAMINE-DEXTROAMPHET ER 20 MG PO CP24: 20.0000 mg | ORAL_CAPSULE | Freq: Two times a day (BID) | ORAL | 0 refills | Status: DC

## 2020-09-30 NOTE — Telephone Encounter (Signed)
pended

## 2020-09-30 NOTE — Telephone Encounter (Signed)
Pt called and needs a refill on her adderall xr 20 mg to be sent to the [facility]  on [address] in [place]

## 2020-10-04 ENCOUNTER — Telehealth (INDEPENDENT_AMBULATORY_CARE_PROVIDER_SITE_OTHER): Payer: 59 | Admitting: Adult Health

## 2020-10-04 ENCOUNTER — Encounter: Payer: Self-pay | Admitting: Adult Health

## 2020-10-04 DIAGNOSIS — F909 Attention-deficit hyperactivity disorder, unspecified type: Secondary | ICD-10-CM

## 2020-10-04 DIAGNOSIS — F411 Generalized anxiety disorder: Secondary | ICD-10-CM | POA: Diagnosis not present

## 2020-10-04 MED ORDER — DIAZEPAM 5 MG PO TABS: 5.0000 mg | ORAL_TABLET | Freq: Two times a day (BID) | ORAL | 2 refills | Status: DC

## 2020-10-04 MED ORDER — AMPHETAMINE-DEXTROAMPHET ER 20 MG PO CP24: 20.0000 mg | ORAL_CAPSULE | Freq: Two times a day (BID) | ORAL | 0 refills | Status: DC

## 2020-10-04 NOTE — Progress Notes (Signed)
[name] [id number] Oct 14, 1985 35 y.o.  Virtual Visit via Video Note  I connected with pt @ on 10/04/20 at 11:40 AM EDT by a video enabled telemedicine application and verified that I am speaking with the correct person using two identifiers.   I discussed the limitations of evaluation and management by telemedicine and the availability of in person appointments. The patient expressed understanding and agreed to proceed.  I discussed the assessment and treatment plan with the patient. The patient was provided an opportunity to ask questions and all were answered. The patient agreed with the plan and demonstrated an understanding of the instructions.   The patient was advised to call back or seek an in-person evaluation if the symptoms worsen or if the condition fails to improve as anticipated.  I provided 20 minutes of non-face-to-face time during this encounter.  The patient was located at home.  [facility]   Dorothyann Gibbsegina N Milburn Freeney, NP   Subjective:   Patient ID:  Judith Hickmanmily Joe is a 35 y.o. (DOB Oct 14, 1985) female.  Chief Complaint: No chief complaint on file.   HPI Judith Hickmanmily Rashad presents for follow-up of MDD, GAD, PTSD, and ADHD.  Describes mood today as "ok". Pleasant. Mood symptoms - denies depression, anxiety, and irritability. Stating "I'm doing good". Working at [employer] - has changed jobs. She and son doing well. Seeing therapist - Stevphen MeuseHolly Ingram. Feels like current medications are working well for her. Stable interest and motivation. Taking medications as prescribed.  Energy levels stable. Active, does not have a regular exercise routine. Walking dogs. Enjoys some usual interests and activities. Single. Lives alone with 34 year old son and 2 dogs. Spending time with family. Attends church.   Appetite adequate. Weight stable - 157 pounds.  Sleeps well most nights. Averages 7 to 8 hours. Focus and concentration stable. Completing tasks. Managing  aspects of household. Works full time [employer]. Denies SI or HI. Denies AH or VH.  Previous medication trials: Multiple medication trials   Review of Systems:  Review of Systems  Musculoskeletal:  Negative for gait problem.  Neurological:  Negative for tremors.  Psychiatric/Behavioral:         Please refer to HPI   Medications: I have reviewed the patient's current medications.  Current Outpatient Medications  Medication Sig Dispense Refill   amoxicillin-clavulanate (AUGMENTIN) 875-125 MG tablet Take 1 tablet by mouth every 12 (twelve) hours. 14 tablet 0   amphetamine-dextroamphetamine (ADDERALL XR) 20 MG 24 hr capsule Take 1 capsule (20 mg total) by mouth 2 (two) times daily. 60 capsule 0   [START ON 11/01/2020] amphetamine-dextroamphetamine (ADDERALL XR) 20 MG 24 hr capsule Take 1 capsule (20 mg total) by mouth 2 (two) times daily. 60 capsule 0   [START ON 11/29/2020] amphetamine-dextroamphetamine (ADDERALL XR) 20 MG 24 hr capsule Take 1 capsule (20 mg total) by mouth 2 (two) times daily. 60 capsule 0   calcium carbonate (TUMS - DOSED IN MG ELEMENTAL CALCIUM) 500 MG chewable tablet Chew 1-2 tablets by mouth 3 (three) times daily as needed for indigestion or heartburn.     diazepam (VALIUM) 10 MG tablet SMARTSIG:1 Tablet(s) By Mouth     diazepam (VALIUM) 5 MG tablet Take 1 tablet (5 mg total) by mouth 2 (two) times daily. 60 tablet 2   fexofenadine (ALLEGRA) 60 MG tablet Take 3 tablets (180 mg total) by mouth daily at 2 PM for 15 days. 15 tablet 0   gabapentin (NEURONTIN) 300 MG capsule Take  300-600 mg by mouth 3 (three) times daily.     HYDROcodone-acetaminophen (NORCO/VICODIN) 5-325 MG tablet Take one by mouth at bedtime as needed for pain 5 tablet 0   ibuprofen (ADVIL,MOTRIN) 600 MG tablet Take 1 tablet (600 mg total) by mouth every 6 (six) hours as needed. 30 tablet 2   Omega-3 Fatty Acids (FISH OIL) 500 MG CAPS Take 1 capsule by mouth daily.     polycarbophil (FIBERCON) 625 MG  tablet Take 1,875 mg by mouth as needed for mild constipation.     predniSONE (DELTASONE) 20 MG tablet Take one tab by mouth twice daily for 4 days, then one daily. Take with food. 12 tablet 0   Prenatal Vit-Fe Fumarate-FA (PRENATAL MULTIVITAMIN) TABS tablet Take 1 tablet by mouth daily at 12 noon.     No current facility-administered medications for this visit.    Medication Side Effects: None  Allergies: No Known Allergies  Past Medical History:  Diagnosis Date   ADHD (attention deficit hyperactivity disorder)    Anxiety    Depression    Headache    Hx of varicella     Family History  Problem Relation Age of Onset   Stroke Maternal Grandmother    Anxiety disorder Maternal Grandmother    Cancer Maternal Grandfather    Stroke Maternal Grandfather    ADD / ADHD Mother    Alcoholism Father    Bipolar disorder Sister     Social History   Socioeconomic History   Marital status: Single    Spouse name: Not on file   Number of children: Not on file   Years of education: Not on file   Highest education level: Not on file  Occupational History   Not on file  Tobacco Use   Smoking status: Former    Pack years: 0.00    Types: Cigarettes    Quit date: 06/25/2015    Years since quitting: 5.2   Smokeless tobacco: Never  Vaping Use   Vaping Use: Never used  Substance and Sexual Activity   Alcohol use: No   Drug use: No   Sexual activity: Not on file  Other Topics Concern   Not on file  Social History Narrative   Not on file   Social Determinants of Health   Financial Resource Strain: Not on file  Food Insecurity: Not on file  Transportation Needs: Not on file  Physical Activity: Not on file  Stress: Not on file  Social Connections: Not on file  Intimate Partner Violence: Not on file    Past Medical History, Surgical history, Social history, and Family history were reviewed and updated as appropriate.   Please see review of systems for further details on the  patient's review from today.   Objective:   Physical Exam:  There were no vitals taken for this visit.  Physical Exam Constitutional:      General: She is not in acute distress. Musculoskeletal:        General: No deformity.  Neurological:     Mental Status: She is alert and oriented to person, place, and time.     Coordination: Coordination normal.  Psychiatric:        Attention and Perception: Attention and perception normal. She does not perceive auditory or visual hallucinations.        Mood and Affect: Mood normal. Mood is not anxious or depressed. Affect is not labile, blunt, angry or inappropriate.        Speech: Speech normal.  Behavior: Behavior normal.        Thought Content: Thought content normal. Thought content is not paranoid or delusional. Thought content does not include homicidal or suicidal ideation. Thought content does not include homicidal or suicidal plan.        Cognition and Memory: Cognition and memory normal.        Judgment: Judgment normal.     Comments: Insight intact    Lab Review:     Component Value Date/Time   NA 141 06/29/2015 2024   K 3.6 06/29/2015 2024   CL 105 06/29/2015 2024   CO2 19 (L) 06/29/2015 1739   GLUCOSE 94 06/29/2015 2024   BUN 8 06/29/2015 2024   CREATININE 0.50 06/29/2015 2024   CALCIUM 8.8 (L) 06/29/2015 1739   PROT 6.4 (L) 06/29/2015 1739   ALBUMIN 3.4 (L) 06/29/2015 1739   AST 25 06/29/2015 1739   ALT 16 06/29/2015 1739   ALKPHOS 37 (L) 06/29/2015 1739   BILITOT 0.5 06/29/2015 1739   GFRNONAA >60 06/29/2015 1739   GFRAA >60 06/29/2015 1739       Component Value Date/Time   WBC 14.7 (H) 12/26/2015 0203   RBC 3.76 (L) 12/26/2015 0203   HGB 12.5 12/26/2015 0203   HCT 34.6 (L) 12/26/2015 0203   PLT 186 12/26/2015 0203   MCV 92.0 12/26/2015 0203   MCH 33.2 12/26/2015 0203   MCHC 36.1 (H) 12/26/2015 0203   RDW 13.1 12/26/2015 0203   LYMPHSABS 3.0 06/29/2015 1739   MONOABS 1.3 (H) 06/29/2015 1739    EOSABS 0.1 06/29/2015 1739   BASOSABS 0.1 06/29/2015 1739    No results found for: POCLITH, LITHIUM   No results found for: PHENYTOIN, PHENOBARB, VALPROATE, CBMZ   .res Assessment: Plan:     Plan:  PDMP reviewed  1. Adderall XR 20mg  twice daily 2. Valium 5mg  BID  BP WNL per patient  Read and reviewed note with patient for accuracy.   RTC 3 months  Patient advised to contact office with any questions, adverse effects, or acute worsening in signs and symptoms.  Discussed potential benefits, risk, and side effects of benzodiazepines to include potential risk of tolerance and dependence, as well as possible drowsiness.  Advised patient not to drive if experiencing drowsiness and to take lowest possible effective dose to minimize risk of dependence and tolerance.  Discussed potential benefits, risks, and side effects of stimulants with patient to include increased heart rate, palpitations, insomnia, increased anxiety, increased irritability, or decreased appetite.  Instructed patient to contact office if experiencing any significant tolerability issues.    Diagnoses and all orders for this visit:  Attention deficit hyperactivity disorder (ADHD), unspecified ADHD type -     amphetamine-dextroamphetamine (ADDERALL XR) 20 MG 24 hr capsule; Take 1 capsule (20 mg total) by mouth 2 (two) times daily. -     amphetamine-dextroamphetamine (ADDERALL XR) 20 MG 24 hr capsule; Take 1 capsule (20 mg total) by mouth 2 (two) times daily. -     amphetamine-dextroamphetamine (ADDERALL XR) 20 MG 24 hr capsule; Take 1 capsule (20 mg total) by mouth 2 (two) times daily.  Generalized anxiety disorder -     diazepam (VALIUM) 5 MG tablet; Take 1 tablet (5 mg total) by mouth 2 (two) times daily.    Please see After Visit Summary for patient specific instructions.  Future Appointments  Date Time Provider Department Center  10/14/2020  4:00 PM , [facility] CP-CP None  11/15/2020  9:00 AM  [facility] None    No orders of the defined types were placed in this encounter.     -------------------------------

## 2020-10-11 ENCOUNTER — Other Ambulatory Visit: Payer: Self-pay

## 2020-10-11 ENCOUNTER — Emergency Department
Admission: RE | Admit: 2020-10-11 | Discharge: 2020-10-11 | Disposition: A | Discharge status: Home / Self Care | Payer: 59 | Source: Ambulatory Visit

## 2020-10-11 VITALS — BP 114/78 | HR 93 | Temp 98.7°F | Resp 17

## 2020-10-11 DIAGNOSIS — H9203 Otalgia, bilateral: Secondary | ICD-10-CM | POA: Diagnosis not present

## 2020-10-11 NOTE — ED Provider Notes (Signed)
[name]    CSN: [id number] Arrival date & time: 10/11/20  1346      History   Chief Complaint Chief Complaint  Patient presents with   Otalgia    Bilateral    HPI Judith Nadel is a 35 y.o. female.   HPI 35 year old female presents with bilateral otalgia for 2 weeks reports left ear worse than right.  Reports history of seasonal allergies and is taking OTC Goody's powders, Sudafed sinus, and Benadryl as needed.  Patient was evaluated by me on 09/09/2020 and found to have left acute otitis media and subacute maxillary sinusitis.  Patient reports taking Augmentin to completion.  Past Medical History:  Diagnosis Date   ADHD (attention deficit hyperactivity disorder)    Anxiety    Depression    Headache    Hx of varicella     Patient Active Problem List   Diagnosis Date Noted   PTSD (post-traumatic stress disorder) 08/23/2019   Aggressive behavior 08/23/2019   Involuntary commitment 08/23/2019   Adjustment disorder with mixed anxiety and depressed mood 07/26/2019   ADHD (attention deficit hyperactivity disorder), combined type 07/15/2017   Vaginal delivery 12/28/2015   Anxiety 12/25/2015   Depression 12/25/2015   Positive GBS test 12/25/2015   Post-dates pregnancy 12/25/2015   Smoker 06/30/2015   Hx of ovarian cyst 06/30/2015   History of ADHD 06/30/2015   Bilateral sensorineural hearing loss 12/13/2012   Otalgia 12/13/2012   Tinnitus 12/13/2012   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 Authorizing Provider  amphetamine-dextroamphetamine (ADDERALL XR) 20 MG 24 hr capsule Take 1 capsule (20 mg total) by  mouth 2 (two) times daily. 10/04/20   Mozingo, Thereasa Solo, NP  amphetamine-dextroamphetamine (ADDERALL XR) 20 MG 24 hr capsule Take 1 capsule (20 mg total) by mouth 2 (two) times daily. 11/01/20   Mozingo, Thereasa Solo, NP  amphetamine-dextroamphetamine (ADDERALL XR) 20 MG 24 hr capsule Take 1 capsule (20 mg total) by mouth 2 (two) times daily. 11/29/20   Mozingo, Thereasa Solo, NP  calcium carbonate (TUMS - DOSED IN MG ELEMENTAL CALCIUM) 500 MG chewable tablet Chew 1-2 tablets by mouth 3 (three) times daily as needed for indigestion or heartburn.    [provider]  diazepam (VALIUM) 10 MG tablet SMARTSIG:1 Tablet(s) By Mouth 12/14/19   [provider]  diazepam (VALIUM) 5 MG tablet Take 1 tablet (5 mg total) by mouth 2 (two) times daily. 10/04/20   Mozingo, Thereasa Solo, NP  fexofenadine (ALLEGRA) 60 MG tablet Take 3 tablets (180 mg total) by mouth daily at 2 PM for 15 days. 09/09/20 09/24/20  Trevor Iha, FNP  gabapentin (NEURONTIN) 300 MG capsule Take 300-600 mg by mouth 3 (three) times daily. 02/01/20   [provider]  HYDROcodone-acetaminophen (NORCO/VICODIN) 5-325 MG tablet Take one by mouth at bedtime as needed for pain 04/17/19  Lattie Haw, MD  ibuprofen (ADVIL,MOTRIN) 600 MG tablet Take 1 tablet (600 mg total) by mouth every 6 (six) hours as needed. 12/29/15   Nigel Bridgeman, CNM  Omega-3 Fatty Acids (FISH OIL) 500 MG CAPS Take 1 capsule by mouth daily.    [provider]  polycarbophil (FIBERCON) 625 MG tablet Take 1,875 mg by mouth as needed for mild constipation.    [provider]  Prenatal Vit-Fe Fumarate-FA (PRENATAL MULTIVITAMIN) TABS tablet Take 1 tablet by mouth daily at 12 noon.    [provider]    Family History Family History  Problem Relation Age of Onset   Stroke Maternal Grandmother    Anxiety disorder Maternal Grandmother    Cancer Maternal Grandfather    Stroke Maternal Grandfather    ADD / ADHD  Mother    Alcoholism Father    Bipolar disorder Sister     Social History Social History   Tobacco Use   Smoking status: Former    Pack years: 0.00    Types: Cigarettes    Quit date: 06/25/2015    Years since quitting: 5.3   Smokeless tobacco: Never  Vaping Use   Vaping Use: Never used  Substance Use Topics   Alcohol use: No   Drug use: No     Allergies   Patient has no known allergies.   Review of Systems Review of Systems  Constitutional: Negative.   HENT:  Positive for ear pain.   Eyes: Negative.   Respiratory: Negative.    Cardiovascular: Negative.   Gastrointestinal: Negative.   Genitourinary: Negative.   Musculoskeletal: Negative.   Skin: Negative.   Neurological: Negative.     Physical Exam Triage Vital Signs ED Triage Vitals  Enc Vitals Group     BP 10/11/20 1433 114/78     Pulse Rate 10/11/20 1433 93     Resp 10/11/20 1433 17     Temp 10/11/20 1433 98.7 F (37.1 C)     Temp Source 10/11/20 1433 Oral     SpO2 10/11/20 1433 100 %     Weight --      Height --      Head Circumference --      Peak Flow --      Pain Score 10/11/20 1435 2     Pain Loc --      Pain Edu? --      Excl. in GC? --    No data found.  Updated Vital Signs BP 114/78 (BP Location: Right Arm)   Pulse 93   Temp 98.7 F (37.1 C) (Oral)   Resp 17   LMP  (LMP Unknown)   SpO2 100%      Physical Exam Vitals and nursing note reviewed.  Constitutional:      General: She is not in acute distress.    Appearance: Normal appearance. She is not ill-appearing.  HENT:     Head: Normocephalic and atraumatic.     Right Ear: Tympanic membrane, ear canal and external ear normal.     Left Ear: Tympanic membrane, ear canal and external ear normal.     Nose: Nose normal. No congestion or rhinorrhea.     Mouth/Throat:     Mouth: Mucous membranes are moist.     Pharynx: Oropharynx is clear.  Eyes:     Extraocular Movements: Extraocular movements intact.     Conjunctiva/sclera:  Conjunctivae normal.     Pupils: Pupils are equal, round, and reactive to light.  Cardiovascular:     Rate and Rhythm: Normal rate and regular rhythm.     Pulses: Normal pulses.     Heart sounds: Normal heart sounds.  Pulmonary:     Effort: Pulmonary effort is normal. No respiratory distress.     Breath sounds: Normal breath sounds. No wheezing, rhonchi or rales.  Musculoskeletal:        General: Normal range of motion.     Cervical back: Normal range of motion and neck supple. No tenderness.  Lymphadenopathy:     Cervical: No cervical adenopathy.  Skin:    General: Skin is warm and dry.  Neurological:     General: No focal deficit present.     Mental Status: She is alert and oriented to person, place, and time.  Psychiatric:        Mood and Affect: Mood normal.        Behavior: Behavior normal.     UC Treatments / Results  Labs (all labs ordered are listed, but only abnormal results are displayed) Labs Reviewed - No data to display  EKG   Radiology No results found.  Procedures Procedures (including critical care time)  Medications Ordered in UC Medications - No data to display  Initial Impression / Assessment and Plan / UC Course  I have reviewed the triage vital signs and the nursing notes.  Pertinent labs & imaging results that were available during my care of the patient were reviewed by me and considered in my medical decision making (see chart for details).    MDM: 1.  Bilateral otalgia.  Patient discharged home, hemodynamically stable. Final Clinical Impressions(s) / UC Diagnoses   Final diagnoses:  Otalgia of both ears     Discharge Instructions      Advised patient may take Ibuprofen 600 mg twice daily, as needed for bilateral otalgia.  Advised patient to follow-up with PCP for possible ENT consult or CT of maxillary sinuses for further evaluation.     ED Prescriptions   None    PDMP not reviewed this encounter.   Trevor Iha,  FNP 10/11/20 1533

## 2020-10-11 NOTE — Discharge Instructions (Addendum)
Advised patient may take Ibuprofen 600 mg twice daily, as needed for bilateral otalgia.  Advised patient to follow-up with PCP for possible ENT consult or CT of maxillary sinuses for further evaluation.

## 2020-10-11 NOTE — ED Triage Notes (Signed)
Pt c/o ear pressure/pain x 2 weeks. Worse in LT ear. Denies fever. Hx of seasonal allergies. Goodys sudafed sinus and benedryl prn. Pain 2/10

## 2020-10-14 ENCOUNTER — Ambulatory Visit (INDEPENDENT_AMBULATORY_CARE_PROVIDER_SITE_OTHER): Payer: 59 | Admitting: Psychiatry

## 2020-10-14 DIAGNOSIS — F4310 Post-traumatic stress disorder, unspecified: Secondary | ICD-10-CM | POA: Diagnosis not present

## 2020-10-14 NOTE — Progress Notes (Signed)
[facility] Counselor/Therapist Progress Note  Patient ID: [name], MRN: [id number],    Date: 10/14/2020  Time Spent: 27 minutes start time 4:05 PM end time 5:03 PM Virtual Visit via Telephone Note Connected with patient by a video enabled telemedicine/telehealth application, with their informed consent, and verified patient privacy and that I am speaking with the correct person using two identifiers. I discussed the limitations, risks, security and privacy concerns of performing psychotherapy and management service by telephone and the availability of in person appointments. I also discussed with the patient that there may be a patient responsible charge related to this service. The patient expressed understanding and agreed to proceed. I discussed the treatment planning with the patient. The patient was provided an opportunity to ask questions and all were answered. The patient agreed with the plan and demonstrated an understanding of the instructions. The patient was advised to call  our office if  symptoms worsen or feel they are in a crisis state and need immediate contact.   Therapist Location: office Patient Location: home    Treatment Type: Individual Therapy  Reported Symptoms: triggered responses, sleep issues, sadness, anxiety agitation, nightmares  Mental Status Exam:  Appearance:   Well Groomed     Behavior:  Appropriate  Motor:  Normal  Speech/Language:   Normal Rate  Affect:  Appropriate  Mood:  normal  Thought process:  normal  Thought content:    WNL  Sensory/Perceptual disturbances:    WNL  Orientation:  oriented to person, place, time/date, and situation  Attention:  Good  Concentration:  Good  Memory:  WNL  Fund of knowledge:   Good  Insight:    Good  Judgment:   Good  Impulse Control:  Good   Risk Assessment: Danger to Self:  No Self-injurious Behavior: No Danger to Others: No Duty to Warn:no Physical Aggression / Violence:No  Access to  Firearms a concern: No  Gang Involvement:No   Subjective: Met with patient via virtual session.  She shared that work is going well.  Patient shared that she and her sister are doing better because she is working on being in a relationship with her and being distant at the same time.  She shared that she has encouraged her to do EMDR with someone since it has helped her to make changes in her life.  She is in the process of moving out of her townhouse and looking for a new home. She stated she is recognizing when she gets triggered by her mother and working to figure out why she is triggered and how to handle it better.  Patient went on to share that the biggest issue she is having is thoughts of her ex her son's father.  Patient did EMDR set on thinking about Sean, suds level 10, negative cognition "I am not good enough" felt irritation and upset in her chest and stomach and chest.  Patient was finally able to reduce suds level to 5.  She was able to recognize she is having lots of anxiety and fear about the possibility of his family or Hilliard Clark tried to contact [place].  As she was processing she recalled several different reasons why she was no longer with him and that even though he ended the relationship she is better off on her own.  Patient was encouraged to affirm herself of that truth on a regular basis.  Interventions: Solution-Oriented/Positive Psychology and Eye Movement Desensitization and Reprocessing (EMDR)  Diagnosis:   ICD-10-CM  1. PTSD (post-traumatic stress disorder)  F43.10       Plan: Patient is to use CBT and coping skills to decrease triggered responses.  Patient is to continue exercising to release emotions appropriately.  Patient is to continue setting limits with family members.  Patient is to take medication as directed. Long-term goal: Recall the traumatic event without becoming overwhelmed with negative emotions Short-term goal: Practice implement relaxation training as a  coping mechanism for tension panic stress anger and anxiety    [name], [facility]

## 2020-10-29 ENCOUNTER — Other Ambulatory Visit: Payer: Self-pay

## 2020-10-29 ENCOUNTER — Encounter: Payer: Self-pay | Admitting: Medical-Surgical

## 2020-10-29 ENCOUNTER — Ambulatory Visit (INDEPENDENT_AMBULATORY_CARE_PROVIDER_SITE_OTHER): Payer: 59 | Admitting: Medical-Surgical

## 2020-10-29 VITALS — BP 108/69 | HR 89 | Temp 98.7°F | Ht 67.0 in | Wt 167.3 lb

## 2020-10-29 DIAGNOSIS — Z114 Encounter for screening for human immunodeficiency virus [HIV]: Secondary | ICD-10-CM

## 2020-10-29 DIAGNOSIS — Z0000 Encounter for general adult medical examination without abnormal findings: Secondary | ICD-10-CM | POA: Diagnosis not present

## 2020-10-29 DIAGNOSIS — Z7689 Persons encountering health services in other specified circumstances: Secondary | ICD-10-CM

## 2020-10-29 DIAGNOSIS — F419 Anxiety disorder, unspecified: Secondary | ICD-10-CM | POA: Diagnosis not present

## 2020-10-29 DIAGNOSIS — Z1159 Encounter for screening for other viral diseases: Secondary | ICD-10-CM

## 2020-10-29 DIAGNOSIS — F902 Attention-deficit hyperactivity disorder, combined type: Secondary | ICD-10-CM

## 2020-10-29 DIAGNOSIS — R4689 Other symptoms and signs involving appearance and behavior: Secondary | ICD-10-CM

## 2020-10-29 DIAGNOSIS — H9203 Otalgia, bilateral: Secondary | ICD-10-CM

## 2020-10-29 DIAGNOSIS — L309 Dermatitis, unspecified: Secondary | ICD-10-CM

## 2020-10-29 DIAGNOSIS — F4323 Adjustment disorder with mixed anxiety and depressed mood: Secondary | ICD-10-CM

## 2020-10-29 DIAGNOSIS — Z1329 Encounter for screening for other suspected endocrine disorder: Secondary | ICD-10-CM

## 2020-10-29 DIAGNOSIS — F4310 Post-traumatic stress disorder, unspecified: Secondary | ICD-10-CM

## 2020-10-29 DIAGNOSIS — F32A Depression, unspecified: Secondary | ICD-10-CM

## 2020-10-29 DIAGNOSIS — H8110 Benign paroxysmal vertigo, unspecified ear: Secondary | ICD-10-CM

## 2020-10-29 MED ORDER — MECLIZINE HCL 25 MG PO TABS: 25.0000 mg | ORAL_TABLET | Freq: Three times a day (TID) | ORAL | 3 refills | Status: DC | PRN

## 2020-10-29 MED ORDER — FLUTICASONE PROPIONATE 50 MCG/ACT NA SUSP: 2.0000 | Freq: Two times a day (BID) | NASAL | 6 refills | Status: DC

## 2020-10-29 MED ORDER — LEVOCETIRIZINE DIHYDROCHLORIDE 5 MG PO TABS: 5.0000 mg | ORAL_TABLET | Freq: Every evening | ORAL | 1 refills | Status: AC

## 2020-10-29 NOTE — Progress Notes (Signed)
New Patient Office Visit  Subjective:  Patient ID: [name], female    DOB: [birth year]  Age: 35 y.o. MRN: [id number]  CC:  Chief Complaint  Patient presents with   Establish Care    HPI [name] presents to establish care.   Ear pain- was seen in [facility] on 5/23 and treated with Augmentin for a sinus/ear infection. Completed her course of antibiotics as prescribed but has continued to have ear pain/discomfort. She was again evaluated at [facility] on 6/24 and told to take Ibuprofen as needed. Today, she continues to have uncomfortable ear sensations that feel like something is in her ears. Is not taking a daily antihistamine or using any nasal sprays.   Rash- has had an intermittent rash that affects her arms, legs, and torso since moving into her current residence in 2018. The rash will start abruptly and is very itchy. After she scratches a couple of times, she is left with bright red welts along her skin. She does have 2 dogs and 2 cats but the rashes predate getting pets. Interested in getting tested for allergies.  Has spells of dizziness and motion sickness about 2-4 times per month. Feels that she is easily off balance and has fallen at times. When dizzy, she feels nauseated and like the room  is spinning. This has been going on since her son was born 4 years ago. Family history of vertigo in several relatives.   Past Medical History:  Diagnosis Date   ADHD (attention deficit hyperactivity disorder)    Anxiety    Depression    Headache    Hx of varicella     Past Surgical History:  Procedure Laterality Date   NO PAST SURGERIES      Family History  Problem Relation Age of Onset   Stroke Maternal Grandmother    Anxiety disorder Maternal Grandmother    Cancer Maternal Grandfather    Stroke Maternal Grandfather    ADD / ADHD Mother    Alcoholism Father    Bipolar disorder Sister     Social History   Socioeconomic History   Marital status: Single    Spouse name: Not on file    Number of children: Not on file   Years of education: Not on file   Highest education level: Not on file  Occupational History   Not on file  Tobacco Use   Smoking status: Former    Pack years: 0.00    Types: Cigarettes    Quit date: 06/25/2015    Years since quitting: 5.3   Smokeless tobacco: Never  Vaping Use   Vaping Use: Never used  Substance and Sexual Activity   Alcohol use: Never   Drug use: Never   Sexual activity: Not Currently    Birth control/protection: Abstinence  Other Topics Concern   Not on file  Social History Narrative   Not on file   Social Determinants of Health   Financial Resource Strain: Not on file  Food Insecurity: Not on file  Transportation Needs: Not on file  Physical Activity: Not on file  Stress: Not on file  Social Connections: Not on file  Intimate Partner Violence: Not on file    ROS Review of Systems  Constitutional:  Negative for chills, fatigue, fever and unexpected weight change.  HENT:  Positive for ear pain and sinus pressure.   Respiratory:  Negative for cough, chest tightness, shortness of breath and wheezing.   Cardiovascular:  Negative for chest pain, palpitations and  leg swelling.  Skin:  Positive for rash.  Neurological:  Positive for dizziness. Negative for light-headedness and headaches.  Psychiatric/Behavioral:  Positive for dysphoric mood. Negative for self-injury, sleep disturbance and suicidal ideas. The patient is nervous/anxious.    Objective:   Today's Vitals: BP 108/69   Pulse 89   Temp 98.7 F (37.1 C)   Ht 5\' 7"  (1.702 m)   Wt 167 lb 4.8 oz (75.9 kg)   LMP 09/30/2020   SpO2 98%   BMI 26.20 kg/m   Physical Exam Vitals reviewed.  Constitutional:      General: She is not in acute distress.    Appearance: Normal appearance.  HENT:     Head: Normocephalic and atraumatic.     Right Ear: Ear canal and external ear normal. There is no impacted cerumen.     Left Ear: Ear canal and external ear normal.  There is no impacted cerumen.     Ears:     Comments: Scarring noted to bilateral TMs. Mild retraction of TMs bilaterally, no erythema, bulging, or drainage.  Cardiovascular:     Rate and Rhythm: Normal rate and regular rhythm.     Pulses: Normal pulses.     Heart sounds: Normal heart sounds. No murmur heard.   No friction rub. No gallop.  Pulmonary:     Effort: Pulmonary effort is normal. No respiratory distress.     Breath sounds: Normal breath sounds. No wheezing.  Skin:    General: Skin is warm and dry.  Neurological:     Mental Status: She is alert and oriented to person, place, and time.  Psychiatric:        Mood and Affect: Mood normal.        Behavior: Behavior normal.        Thought Content: Thought content normal.        Judgment: Judgment normal.    Assessment & Plan:   1. Encounter to establish care Reviewed available information and discussed health concerns with patient.   2. Anxiety 3. Depression, unspecified depression type 4. PTSD (post-traumatic stress disorder) 5. ADHD (attention deficit hyperactivity disorder), combined type 6. Adjustment disorder with mixed anxiety and depressed mood 7. Aggressive behavior Managed by psychiatry/counseling.   8. Screening for endocrine disorder Checking TSH. - TSH  9. Need for hepatitis C screening test 10. Screening for HIV (human immunodeficiency virus) Discussed screening recommendations. Patient agreeable so adding to blood work today.  - Hepatitis C antibody - HIV Antibody (routine testing w rflx)  11. Dermatitis Unclear etiology. No symptoms present today but patient does have photos on her phone. Suspect an environmental exposure. Referring to allergy for further testing.  - Ambulatory referral to Allergy  12. Preventative health care Checking CBC w/diff, CMP, and lipid panel - CBC with Differential/Platelet - COMPLETE METABOLIC PANEL WITH GFR - Lipid panel  13. Bilateral otalgia Suspect this is  related to eustachian tube dysfunction and a resolve MEE. Recommend taking a daily antihistamine such as Xyzal and using Flonase or other nasal steroid spray to help relieve symptoms. Prescriptions sent to pharmacy to see if they will be more affordable if covered.   14. BPPV No symptoms today. Meclizine 25mg  TID prn. May benefit from vestibular rehab.    Outpatient Encounter Medications as of 10/29/2020  Medication Sig   amphetamine-dextroamphetamine (ADDERALL XR) 20 MG 24 hr capsule Take 1 capsule (20 mg total) by mouth 2 (two) times daily.   [START ON 11/01/2020] amphetamine-dextroamphetamine (ADDERALL XR) 20  MG 24 hr capsule Take 1 capsule (20 mg total) by mouth 2 (two) times daily.   [START ON 11/29/2020] amphetamine-dextroamphetamine (ADDERALL XR) 20 MG 24 hr capsule Take 1 capsule (20 mg total) by mouth 2 (two) times daily.   Aspirin-Acetaminophen-Caffeine (GOODYS EXTRA STRENGTH PO) Take 1 packet by mouth daily as needed.   diazepam (VALIUM) 5 MG tablet Take 1 tablet (5 mg total) by mouth 2 (two) times daily.   fluticasone (FLONASE) 50 MCG/ACT nasal spray Place 2 sprays into both nostrils in the morning and at bedtime. After 3-5 days reduce to once daily.   levocetirizine (XYZAL) 5 MG tablet Take 1 tablet (5 mg total) by mouth every evening.   meclizine (ANTIVERT) 25 MG tablet Take 1 tablet (25 mg total) by mouth 3 (three) times daily as needed for dizziness or nausea.   [DISCONTINUED] calcium carbonate (TUMS - DOSED IN MG ELEMENTAL CALCIUM) 500 MG chewable tablet Chew 1-2 tablets by mouth 3 (three) times daily as needed for indigestion or heartburn.   [DISCONTINUED] diazepam (VALIUM) 10 MG tablet SMARTSIG:1 Tablet(s) By Mouth   [DISCONTINUED] fexofenadine (ALLEGRA) 60 MG tablet Take 3 tablets (180 mg total) by mouth daily at 2 PM for 15 days.   [DISCONTINUED] gabapentin (NEURONTIN) 300 MG capsule Take 300-600 mg by mouth 3 (three) times daily.   [DISCONTINUED] HYDROcodone-acetaminophen  (NORCO/VICODIN) 5-325 MG tablet Take one by mouth at bedtime as needed for pain   [DISCONTINUED] ibuprofen (ADVIL,MOTRIN) 600 MG tablet Take 1 tablet (600 mg total) by mouth every 6 (six) hours as needed.   [DISCONTINUED] Omega-3 Fatty Acids (FISH OIL) 500 MG CAPS Take 1 capsule by mouth daily.   [DISCONTINUED] polycarbophil (FIBERCON) 625 MG tablet Take 1,875 mg by mouth as needed for mild constipation.   [DISCONTINUED] Prenatal Vit-Fe Fumarate-FA (PRENATAL MULTIVITAMIN) TABS tablet Take 1 tablet by mouth daily at 12 noon.   No facility-administered encounter medications on file as of 10/29/2020.    Follow-up: Return if symptoms worsen or fail to improve.   Thayer Ohm, DNP, APRN, FNP-BC  [facility]

## 2020-10-30 LAB — CBC WITH DIFFERENTIAL/PLATELET
Absolute Monocytes: 624 cells/uL (ref 200–950)
Basophils Absolute: 42 cells/uL (ref 0–200)
Basophils Relative: 0.4 %
Eosinophils Absolute: 42 cells/uL (ref 15–500)
Eosinophils Relative: 0.4 %
HCT: 40.4 % (ref 35.0–45.0)
Hemoglobin: 13.7 g/dL (ref 11.7–15.5)
Lymphs Abs: 2714 cells/uL (ref 850–3900)
MCH: 31.9 pg (ref 27.0–33.0)
MCHC: 33.9 g/dL (ref 32.0–36.0)
MCV: 94.2 fL (ref 80.0–100.0)
MPV: 11.2 fL (ref 7.5–12.5)
Monocytes Relative: 6 %
Neutro Abs: 6978 cells/uL (ref 1500–7800)
Neutrophils Relative %: 67.1 %
Platelets: 267 10*3/uL (ref 140–400)
RBC: 4.29 10*6/uL (ref 3.80–5.10)
RDW: 12.2 % (ref 11.0–15.0)
Total Lymphocyte: 26.1 %
WBC: 10.4 10*3/uL (ref 3.8–10.8)

## 2020-10-30 LAB — COMPLETE METABOLIC PANEL WITH GFR
AG Ratio: 1.4 (calc) (ref 1.0–2.5)
ALT: 15 U/L (ref 6–29)
AST: 20 U/L (ref 10–30)
Albumin: 4.5 g/dL (ref 3.6–5.1)
Alkaline phosphatase (APISO): 56 U/L (ref 31–125)
BUN: 10 mg/dL (ref 7–25)
CO2: 28 mmol/L (ref 20–32)
Calcium: 9.9 mg/dL (ref 8.6–10.2)
Chloride: 104 mmol/L (ref 98–110)
Creat: 0.85 mg/dL (ref 0.50–0.97)
Globulin: 3.2 g/dL (calc) (ref 1.9–3.7)
Glucose, Bld: 85 mg/dL (ref 65–99)
Potassium: 3.8 mmol/L (ref 3.5–5.3)
Sodium: 140 mmol/L (ref 135–146)
Total Bilirubin: 0.5 mg/dL (ref 0.2–1.2)
Total Protein: 7.7 g/dL (ref 6.1–8.1)
eGFR: 93 mL/min/{1.73_m2} (ref >=60)

## 2020-10-30 LAB — HEPATITIS C ANTIBODY
Hepatitis C Ab: NONREACTIVE
SIGNAL TO CUT-OFF: 0.01 (ref <1.00)

## 2020-10-30 LAB — HIV ANTIBODY (ROUTINE TESTING W REFLEX): HIV 1&2 Ab, 4th Generation: NONREACTIVE

## 2020-10-30 LAB — LIPID PANEL
Cholesterol: 200 mg/dL — ABNORMAL HIGH (ref <200)
HDL: 62 mg/dL (ref >=50)
LDL Cholesterol (Calc): 117 mg/dL (calc) — ABNORMAL HIGH
Non-HDL Cholesterol (Calc): 138 mg/dL (calc) — ABNORMAL HIGH (ref <130)
Total CHOL/HDL Ratio: 3.2 (calc) (ref <5.0)
Triglycerides: 106 mg/dL (ref <150)

## 2020-10-30 LAB — TSH: TSH: 1.32 mIU/L

## 2020-11-15 ENCOUNTER — Ambulatory Visit (INDEPENDENT_AMBULATORY_CARE_PROVIDER_SITE_OTHER): Payer: 59 | Admitting: Psychiatry

## 2020-11-15 DIAGNOSIS — F4310 Post-traumatic stress disorder, unspecified: Secondary | ICD-10-CM | POA: Diagnosis not present
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
## 2020-11-26 ENCOUNTER — Ambulatory Visit (INDEPENDENT_AMBULATORY_CARE_PROVIDER_SITE_OTHER): Payer: 59 | Admitting: Psychiatry

## 2020-11-26 DIAGNOSIS — F4310 Post-traumatic stress disorder, unspecified: Secondary | ICD-10-CM | POA: Diagnosis not present

## 2020-11-26 NOTE — Progress Notes (Addendum)
[facility] Counselor/Therapist Progress Note  Patient ID: Atira Borello, MRN: [id number],    Date: 11/26/2020  Time Spent: 52 minutes start time 10:04 AM end time 10:56 AM Virtual Visit via [place] with patient by a video enabled telemedicine/telehealth application with their informed consent, and verified patient privacy and that I am speaking with the correct person using two identifiers. I discussed the limitations, risks, security and privacy concerns of performing psychotherapy and management service by telephone and the availability of in person appointments. I also discussed with the patient that there may be a patient responsible charge related to this service. The patient expressed understanding and agreed to proceed. I discussed the treatment planning with the patient. The patient was provided an opportunity to ask questions and all were answered. The patient agreed with the plan and demonstrated an understanding of the instructions. The patient was advised to call  our office if  symptoms worsen or feel they are in a crisis state and need immediate contact.   Therapist Location: office Patient Location: home    Treatment Type: Individual Therapy  Reported Symptoms: anxiety, sadness, triggered responses, panic  Mental Status Exam:  Appearance:   Casual and Neat     Behavior:  Appropriate  Motor:  Normal  Speech/Language:   Normal Rate  Affect:  Appropriate  Mood:  anxious  Thought process:  normal  Thought content:    WNL  Sensory/Perceptual disturbances:    WNL  Orientation:  oriented to person, place, time/date, and situation  Attention:  Good  Concentration:  Good  Memory:  WNL  Fund of knowledge:   Good  Insight:    Good  Judgment:   Good  Impulse Control:  Good   Risk Assessment: Danger to Self:  No Self-injurious Behavior: No Danger to Others: No Duty to Warn:no Physical Aggression / Violence:No  Access to Firearms a concern: No  Gang  Involvement:No   Subjective: met with patient via virtual session.  She shared that she had rough morning. She shared that there was an issue with her sister.  She went on to share her sister had stopped talking with her after an incident that occurred with her son when she was on the phone with her sister.  The whole incident triggered all the panic of her sister taking out the involuntary commitment On her a year ago.  Patient stated the whole situation was extremely traumatic especially for her son who was taken away from his mother when all of the allegations were false.  Patient shared she called her mother right after the whole incident and shared what she had done to handle it, and hopes that she would have another incident like the past.  Patient was able to process that situation some and remind herself that she is okay and can handle what ever has happened.  Shared the biggest issue for her currently is that her son asked about his biological father and stated he would like to see him.  Patient reported she is very fearful of how things may work out in the situation.  At the same time she shared she wants to do what is best for her son.  Discussed ways to handle the situation and how to communicate with biological father if she chooses to do so.  Patient reported as a child when her father passed away and was no longer in her life it was very traumatic and she does not want her son to  be without a father if possible.  Patient was encouraged to recognize that she cannot control his choices but she can give an invitation in a very nonthreatening manner and allow him to make the decision he needs to for himself and her son.  Patient reported feeling positive about that plan and agreed at this time it is best just to give herself a week to think about it and then if she chooses to try and contact him at that time.   Interventions: Solution-Oriented/Positive Psychology and Insight-Oriented  Diagnosis:    ICD-10-CM   1. PTSD (post-traumatic stress disorder)  F43.10       Plan: Patient is to use CBT and coping skills to decrease triggered responses.  Patient is to distance herself currently from her sister and mother.  Patient is to take time to think about whether or not she wants to contact son's biological father.  Is to continue exercising to release negative emotions. Long-term goal: Recall the traumatic event without becoming overwhelmed with negative emotions Short-term goal: Practice implement relaxation training as a coping mechanism for tension panic stress anger and anxiety    [name], [facility]

## 2020-11-29 ENCOUNTER — Ambulatory Visit (INDEPENDENT_AMBULATORY_CARE_PROVIDER_SITE_OTHER): Payer: 59 | Admitting: Psychiatry

## 2020-11-29 DIAGNOSIS — F4310 Post-traumatic stress disorder, unspecified: Secondary | ICD-10-CM | POA: Diagnosis not present
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
## 2020-12-05 ENCOUNTER — Telehealth: Payer: Self-pay
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
## 2020-12-05 NOTE — Telephone Encounter (Signed)
Prior Authorization submitted and approved for ADDERALL XR 20 MG effective 12/04/2020-12/03/2021 with [employer] ID# [id number]

## 2020-12-10 ENCOUNTER — Ambulatory Visit (INDEPENDENT_AMBULATORY_CARE_PROVIDER_SITE_OTHER): Payer: 59 | Admitting: Psychiatry

## 2020-12-10 DIAGNOSIS — F4310 Post-traumatic stress disorder, unspecified: Secondary | ICD-10-CM

## 2020-12-10 NOTE — Progress Notes (Signed)
[facility] Counselor/Therapist Progress Note  Patient ID: [name], MRN: [id number],    Date: 12/10/2020  Time Spent: 50 minutes start time 11:03 AM end time 11:53 AM Virtual Visit via Video Note Connected with patient by a video enabled telemedicine/telehealth application, with their informed consent, and verified patient privacy and that I am speaking with the correct person using two identifiers. I discussed the limitations, risks, security and privacy concerns of performing psychotherapy and management service by telephone and the availability of in person appointments. I also discussed with the patient that there may be a patient responsible charge related to this service. The patient expressed understanding and agreed to proceed. I discussed the treatment planning with the patient. The patient was provided an opportunity to ask questions and all were answered. The patient agreed with the plan and demonstrated an understanding of the instructions. The patient was advised to call  our office if  symptoms worsen or feel they are in a crisis state and need immediate contact.   Therapist Location: office Patient Location: home    Treatment Type: Individual Therapy  Reported Symptoms: anxiety, panic, sadness, triggered responses, hurt, nightmares  Mental Status Exam:  Appearance:   Well Groomed     Behavior:  Appropriate  Motor:  Normal  Speech/Language:   Normal Rate  Affect:  Appropriate  Mood:  anxious  Thought process:  normal  Thought content:    WNL  Sensory/Perceptual disturbances:    WNL  Orientation:  oriented to person, place, time/date, and situation  Attention:  Good  Concentration:  Good  Memory:  WNL  Fund of knowledge:   Good  Insight:    Good  Judgment:   Good  Impulse Control:  Good   Risk Assessment: Danger to Self:  No Self-injurious Behavior: No Danger to Others: No Duty to Warn:no Physical Aggression / Violence:No  Access to Firearms a  concern: No  Gang Involvement:No   Subjective: Met with patient via virtual session.  She shared that her son's biological father had his attorney send a letter threatening a restraining order if she contacts him again.  Encouraged patient to realize that it may be a positive thing because as her son gets older she can let him know it was his father choice to not see him and it can't be blamed on her.  Patient shared she is feeling rejected and abandon by everyone.  She shared that her sister and mother haven't been supportive either.  Patient shared she is realizing that she was feeling better about herself when she wasn't in a relationship with them.  Patient also discussed the fact that she is struggling with what to do with her son's birthday party because she does not know how to be able to have other kids there his age especially hurt his cousins when his sister is being difficult and not agreeing to come.  Patient did report there were a few kids that would be coming.  Encouraged her just to make an exciting time with those children alone to recognize that her son will be happy just having kids there.  Patient was encouraged to try and figure out different ways that she could connect with other people and work towards developing a larger support system.  Patient reported she is a member of a workout facility and she can start going there and attending classes.  Patient also discussed the possibility of finding some activities for her son and meeting some people  through those activities.  Patient was encouraged to continue reminding herself of how far she has come in the facts/truth about herself.  Interventions: Cognitive Behavioral Therapy, Solution-Oriented/Positive Psychology, and Insight-Oriented  Diagnosis:   ICD-10-CM   1. PTSD (post-traumatic stress disorder)  F43.10       Plan: Patient is to use CBT and coping skills to decrease triggered responses.  Patient is to start attending the gym  more regularly and engaging in classes.  Patient is to work on IT sales professional and CBT filters when interacting with family members.  Patient is to take medication as directed. Long-term goal: Recall the traumatic event without becoming overwhelmed with negative emotions Short-term goal: Practice implement relaxation training as a coping mechanism for tension panic stress anger and anxiety  [name], [facility]

## 2020-12-17 ENCOUNTER — Ambulatory Visit (INDEPENDENT_AMBULATORY_CARE_PROVIDER_SITE_OTHER): Payer: 59 | Admitting: Psychiatry

## 2020-12-17 DIAGNOSIS — F4310 Post-traumatic stress disorder, unspecified: Secondary | ICD-10-CM | POA: Diagnosis not present

## 2020-12-17 NOTE — Progress Notes (Signed)
    [facility] Counselor/Therapist Progress Note  Patient ID: [name], MRN: [id number],    Date: 12/17/2020  Time Spent: 50 minutes start time 10:07 AM end time 10:57 AM Virtual Visit via Video Note Connected with patient by a video enabled telemedicine/telehealth application, with their informed consent, and verified patient privacy and that I am speaking with the correct person using two identifiers. I discussed the limitations, risks, security and privacy concerns of performing psychotherapy and management service by telephone and the availability of in person appointments. I also discussed with the patient that there may be a patient responsible charge related to this service. The patient expressed understanding and agreed to proceed. I discussed the treatment planning with the patient. The patient was provided an opportunity to ask questions and all were answered. The patient agreed with the plan and demonstrated an understanding of the instructions. The patient was advised to call  our office if  symptoms worsen or feel they are in a crisis state and need immediate contact.   Therapist Location: office Patient Location: home    Treatment Type: Individual Therapy  Reported Symptoms: anxiety, triggered responses,sadness, lonely  Mental Status Exam:  Appearance:   Casual     Behavior:  Appropriate  Motor:  Normal  Speech/Language:   Normal Rate  Affect:  Appropriate  Mood:  anxious  Thought process:  normal  Thought content:    WNL  Sensory/Perceptual disturbances:    WNL  Orientation:  oriented to person, place, time/date, and situation  Attention:  Good  Concentration:  Good  Memory:  WNL  Fund of knowledge:   Good  Insight:    Good  Judgment:   Good  Impulse Control:  Good   Risk Assessment: Danger to Self:  No Self-injurious Behavior: No Danger to Others: No Duty to Warn:no Physical Aggression / Violence:No  Access to Firearms a concern: No  Gang  Involvement:No   Subjective: Met with patient via virtual session.  She shared she was working out again and that has helped her mood again. She stated that she is realizing that the incident with Shawn gave her and her son closure.  Patient went on to share that things are better with her mother and sister.  She explained she is handling things more realistically and recognizing they are who they are and trying to maintain perspective on their choices and behaviors.  Patient shared that work has been going very well and she is starting to develop relationships that she feels are supportive and that is making her feel better.  She is also started following through on plans from sessions and is using her coping skills appropriately.  Patient was able to recognize that she is still tweaking how she communicates with others and in the past has let everything go even inappropriate behaviors but is currently to the extreme of not letting anything go now she is working to be in the middle and trying to handle things in a balanced manner.  Patient was encouraged to feel positive about all the work that she is done in treatment and the progress that she is made.  She was able to acknowledge that she is feeling ready to move to another state and get a new start so that she can put all the trauma of the past behind her.  She also shared that she feels she is starting to get ready to date and wants to start seeking other relationships.  Patient was encouraged to   journal and recognize anything that is triggered as she starts progressing into dating relationships.  Patient was also encouraged to continue to exercise and focus on appropriate limit setting.  Interventions: Cognitive Behavioral Therapy and Solution-Oriented/Positive Psychology  Diagnosis:   ICD-10-CM   1. PTSD (post-traumatic stress disorder)  F43.10       Plan: Patient is to use CBT and coping skills to continue decreasing triggered responses.  Patient  is to continue working on limit setting with others appropriately.  Patient is to continue exercising and journaling to release negative emotions appropriately.   Long-term goal: Recall the traumatic event without becoming overwhelmed with negative emotions Short-term goal: Practice implement relaxation training as a coping mechanism for tension panic stress anger and anxiety    Holly Ingram, LCMHC                   

## 2020-12-25 ENCOUNTER — Ambulatory Visit (INDEPENDENT_AMBULATORY_CARE_PROVIDER_SITE_OTHER): Payer: 59 | Admitting: Psychiatry

## 2020-12-25 DIAGNOSIS — F4310 Post-traumatic stress disorder, unspecified: Secondary | ICD-10-CM
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
## 2021-01-01 ENCOUNTER — Ambulatory Visit: Payer: Self-pay | Admitting: Allergy

## 2021-01-13 ENCOUNTER — Ambulatory Visit (INDEPENDENT_AMBULATORY_CARE_PROVIDER_SITE_OTHER): Payer: 59 | Admitting: Psychiatry

## 2021-01-13 ENCOUNTER — Other Ambulatory Visit: Payer: Self-pay

## 2021-01-13 ENCOUNTER — Emergency Department (INDEPENDENT_AMBULATORY_CARE_PROVIDER_SITE_OTHER)
Admission: RE | Admit: 2021-01-13 | Discharge: 2021-01-13 | Disposition: A | Discharge status: Home / Self Care | Payer: 59 | Source: Ambulatory Visit | Attending: Family Medicine | Admitting: Family Medicine

## 2021-01-13 VITALS — BP 121/86 | HR 96 | Temp 98.2°F | Wt 170.0 lb

## 2021-01-13 DIAGNOSIS — F4310 Post-traumatic stress disorder, unspecified: Secondary | ICD-10-CM

## 2021-01-13 DIAGNOSIS — J22 Unspecified acute lower respiratory infection: Secondary | ICD-10-CM | POA: Diagnosis not present

## 2021-01-13 DIAGNOSIS — R059 Cough, unspecified: Secondary | ICD-10-CM

## 2021-01-13 MED ORDER — BENZONATATE 200 MG PO CAPS: 200.0000 mg | ORAL_CAPSULE | Freq: Two times a day (BID) | ORAL | 0 refills | Status: DC | PRN

## 2021-01-13 MED ORDER — AZITHROMYCIN 250 MG PO TABS: ORAL_TABLET | ORAL | 0 refills | Status: DC

## 2021-01-13 NOTE — Discharge Instructions (Addendum)
Continue to drink lots of fluids Run a humidifier if you have 1 Take the antibiotic as directed Take Tessalon 2 times a day for cough May take Mucinex DM in addition
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
## 2021-01-13 NOTE — ED Triage Notes (Signed)
Pt c/o fatigue, cough and post nasal drip x2 weeks. Taking mucinex and delsym with no relief.

## 2021-01-14 NOTE — ED Provider Notes (Signed)
[name]    CSN: [id number] Arrival date & time: 01/13/21  0948      History   Chief Complaint Chief Complaint  Patient presents with   Cough    10 am appt. Cough and post nasal drip x2 weeks. Taking mucinex and delsym.     HPI [name] is a 35 y.o. female.   HPI  Patient is here for cough, postnasal drip, runny nose, sinus pressure for a 2-week period of time.  She has been taking Mucinex and Delsym with no relief.  She is non-smoker.  (Former) No underlying lung disease or asthma  Past Medical History:  Diagnosis Date   ADHD (attention deficit hyperactivity disorder)    Anxiety    Depression    Headache    Hx of varicella     Patient Active Problem List   Diagnosis Date Noted   PTSD (post-traumatic stress disorder) 08/23/2019   Aggressive behavior 08/23/2019   Involuntary commitment 08/23/2019   Adjustment disorder with mixed anxiety and depressed mood 07/26/2019   ADHD (attention deficit hyperactivity disorder), combined type 07/15/2017   Vaginal delivery 12/28/2015   Anxiety 12/25/2015   Depression 12/25/2015   Positive GBS test 12/25/2015   Post-dates pregnancy 12/25/2015   Smoker 06/30/2015   Hx of ovarian cyst 06/30/2015   History of ADHD 06/30/2015   Bilateral sensorineural hearing loss 12/13/2012   Otalgia 12/13/2012   Tinnitus 12/13/2012   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 Authorizing Provider  azithromycin (ZITHROMAX Z-PAK) 250 MG tablet Take two pills today followed by one a day until gone 01/13/21  Yes Eustace Moore, MD  benzonatate (TESSALON) 200 MG capsule Take 1 capsule  (200 mg total) by mouth 2 (two) times daily as needed for cough. 01/13/21  Yes Eustace Moore, MD  amphetamine-dextroamphetamine (ADDERALL XR) 20 MG 24 hr capsule Take 1 capsule (20 mg total) by mouth 2 (two) times daily. 10/04/20   Mozingo, Thereasa Solo, NP  Aspirin-Acetaminophen-Caffeine (GOODYS EXTRA STRENGTH PO) Take 1 packet by mouth daily as needed.    [provider]  diazepam (VALIUM) 5 MG tablet Take 1 tablet (5 mg total) by mouth 2 (two) times daily. 10/04/20   Mozingo, Thereasa Solo, NP  levocetirizine (XYZAL) 5 MG tablet Take 1 tablet (5 mg total) by mouth every evening. 10/29/20   Christen Butter, NP    Family History Family History  Problem Relation Age of Onset   Stroke Maternal Grandmother    Anxiety disorder Maternal Grandmother    Cancer Maternal Grandfather    Stroke Maternal Grandfather    ADD / ADHD Mother    Alcoholism Father    Bipolar disorder Sister     Social History Social History   Tobacco Use   Smoking status: Former    Types: Cigarettes    Quit  date: 06/25/2015    Years since quitting: 5.5   Smokeless tobacco: Never  Vaping Use   Vaping Use: Never used  Substance Use Topics   Alcohol use: Never   Drug use: Never     Allergies   Patient has no known allergies.   Review of Systems Review of Systems See HPI  Physical Exam Triage Vital Signs ED Triage Vitals  Enc Vitals Group     BP 01/13/21 1020 121/86     Pulse Rate 01/13/21 1020 96     Resp --      Temp 01/13/21 1020 98.2 F (36.8 C)     Temp Source 01/13/21 1020 Oral     SpO2 01/13/21 1020 100 %     Weight 01/13/21 1021 170 lb (77.1 kg)     Height --      Head Circumference --      Peak Flow --      Pain Score 01/13/21 1021 0     Pain Loc --      Pain Edu? --      Excl. in GC? --    No data found.  Updated Vital Signs BP 121/86 (BP Location: Right Arm)   Pulse 96   Temp 98.2 F (36.8 C) (Oral)   Wt 77.1 kg   LMP 12/13/2020 (Approximate)   SpO2 100%   BMI  26.63 kg/m      Physical Exam Constitutional:      General: She is not in acute distress.    Appearance: She is well-developed. She is ill-appearing.  HENT:     Head: Normocephalic and atraumatic.     Right Ear: Tympanic membrane, ear canal and external ear normal.     Left Ear: Tympanic membrane, ear canal and external ear normal.     Nose: Congestion and rhinorrhea present.     Mouth/Throat:     Pharynx: Posterior oropharyngeal erythema present.  Eyes:     Conjunctiva/sclera: Conjunctivae normal.     Pupils: Pupils are equal, round, and reactive to light.  Cardiovascular:     Rate and Rhythm: Normal rate and regular rhythm.     Heart sounds: Normal heart sounds.  Pulmonary:     Effort: Pulmonary effort is normal. No respiratory distress.     Breath sounds: Rhonchi present.  Abdominal:     General: There is no distension.     Palpations: Abdomen is soft.  Musculoskeletal:        General: Normal range of motion.     Cervical back: Normal range of motion.  Lymphadenopathy:     Cervical: Cervical adenopathy present.  Skin:    General: Skin is warm and dry.  Neurological:     Mental Status: She is alert.  Psychiatric:        Mood and Affect: Mood normal.        Behavior: Behavior normal.     UC Treatments / Results  Labs (all labs ordered are listed, but only abnormal results are displayed) Labs Reviewed - No data to display  EKG   Radiology No results found.  Procedures Procedures (including critical care time)  Medications Ordered in UC Medications - No data to display  Initial Impression / Assessment and Plan / UC Course  I have reviewed the triage vital signs and the nursing notes.  Pertinent labs & imaging results that were available during my care of the patient were reviewed by me and considered in my medical decision making (see  chart for details).     Viral upper respiratory infection should be abating at 1 and half weeks.  We will treat with  antibiotics and add Tessalon for the cough.  Follow-up with primary care Final Clinical Impressions(s) / UC Diagnoses   Final diagnoses:  LRTI (lower respiratory tract infection)  Cough     Discharge Instructions      Continue to drink lots of fluids Run a humidifier if you have 1 Take the antibiotic as directed Take Tessalon 2 times a day for cough May take Mucinex DM in addition    ED Prescriptions     Medication Sig Dispense Auth. Provider   benzonatate (TESSALON) 200 MG capsule Take 1 capsule (200 mg total) by mouth 2 (two) times daily as needed for cough. 20 capsule Eustace Moore, MD   azithromycin (ZITHROMAX Z-PAK) 250 MG tablet Take two pills today followed by one a day until gone 6 tablet Delton See Letta Pate, MD      PDMP not reviewed this encounter.   Eustace Moore, MD 01/14/21 1728

## 2021-01-22 ENCOUNTER — Ambulatory Visit (INDEPENDENT_AMBULATORY_CARE_PROVIDER_SITE_OTHER): Payer: 59 | Admitting: Psychiatry

## 2021-01-22 DIAGNOSIS — F4310 Post-traumatic stress disorder, unspecified: Secondary | ICD-10-CM | POA: Diagnosis not present
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
## 2021-01-29 ENCOUNTER — Ambulatory Visit: Payer: 59 | Admitting: Psychiatry

## 2021-02-05 ENCOUNTER — Ambulatory Visit (INDEPENDENT_AMBULATORY_CARE_PROVIDER_SITE_OTHER): Payer: 59 | Admitting: Psychiatry

## 2021-02-05 DIAGNOSIS — F4310 Post-traumatic stress disorder, unspecified: Secondary | ICD-10-CM | POA: Diagnosis not present
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
## 2021-02-10 ENCOUNTER — Ambulatory Visit: Payer: 59 | Admitting: Medical-Surgical

## 2021-02-12 NOTE — Progress Notes (Signed)
NEW PATIENT Date of Service/Encounter:  02/13/21 Referring provider: Christen Butter, NP Primary care provider: Christen Butter, NP  Subjective:  [name] is a 35 y.o. female with a PMHx of anxiety, bilateral sensorineural hearing loss, hx of ADHD presenting today for evaluation of rash History obtained from: chart review and patient.   PCP visit on 10/29/20-rash intermittent (hives)  Rash Started since moving into her house in 2018.  Pictures reviewed consistent with dermatographism.  She reports having itchy skin and when she scratches it will get red raised rash.  In addition to the dermatographism gets hives. Occurring less frequently than when initially started.  Last occurrence in June 2022. Usually worse in the summer and when it is hot.  Doesn't happen in shower with exercise. No obvious triggers. No changes to her diet, personal care products, etc. No fevers, weight loss, joint pain, joint swelling. Never been allergy tested. Will take a Benadryl and this usually resolves rash. Rash typically lasts a few hours.  No bruising on resolution.  Reports having tongue swelling after walking in the woods once, but this has never happened again and improved with Benadryl.  She thinks she may have environmental allergies.  Had some asthma as a teenager, but none since.  A few times when she feels sick, feels like she needs an albuterol inhaler.  Uses "once in a blue moon".  Other allergy screening: Food allergy: no Medication allergy: no Hymenoptera allergy: no Eczema:no History of recurrent infections suggestive of immunodeficency: no   Past Medical History: Past Medical History:  Diagnosis Date   ADHD (attention deficit hyperactivity disorder)    Angio-edema    Anxiety    Asthma    Depression    Headache    Hx of varicella    Urticaria    Medication List:  Current Outpatient Medications  Medication Sig Dispense Refill   albuterol (VENTOLIN HFA) 108 (90 Base) MCG/ACT  inhaler Inhale 2 puffs into the lungs every 6 (six) hours as needed for wheezing or shortness of breath. 18 g 2   amphetamine-dextroamphetamine (ADDERALL XR) 20 MG 24 hr capsule Take 1 capsule (20 mg total) by mouth 2 (two) times daily. 60 capsule 0   Aspirin-Acetaminophen-Caffeine (GOODYS EXTRA STRENGTH PO) Take 1 packet by mouth daily as needed.     diazepam (VALIUM) 5 MG tablet Take 1 tablet (5 mg total) by mouth 2 (two) times daily. 60 tablet 2   levocetirizine (XYZAL) 5 MG tablet Take 1 tablet (5 mg total) by mouth every evening. 30 tablet 1   meclizine (ANTIVERT) 12.5 MG tablet Take 12.5 mg by mouth daily as needed for dizziness (Vertigo).     No current facility-administered medications for this visit.   Known Allergies:  No Known Allergies Past Surgical History: Past Surgical History:  Procedure Laterality Date   NO PAST SURGERIES     TYMPANOSTOMY TUBE PLACEMENT     Family History: Family History  Problem Relation Age of Onset   ADD / ADHD Mother    Alcoholism Father    Bipolar disorder Sister    Stroke Maternal Grandmother    Anxiety disorder Maternal Grandmother    Cancer Maternal Grandfather    Stroke Maternal Grandfather    Allergic rhinitis Neg Hx    Asthma Neg Hx    Eczema Neg Hx    Urticaria Neg Hx    Social History: Ennifer lives in a home built 15 years ago, no water damage, carpet in bedroom, gas heat, central  AC, 2 pet dogs and 2 cats, no pests, no smoke exposure, works as Emergency planning/management officer x 10 years.   ROS:  All other systems negative except as noted per HPI.  Objective:  Blood pressure 110/64, pulse 86, temperature 98 F (36.7 C), temperature source Temporal, resp. rate 16, height 5' 7.5" (1.715 m), weight 169 lb (76.7 kg), SpO2 100 %. Body mass index is 26.08 kg/m. Physical Exam:  General Appearance:  Alert, cooperative, no distress, appears stated age  Head:  Normocephalic, without obvious abnormality, atraumatic  Eyes:  Conjunctiva clear, EOM's  intact  Nose: Nares normal, normal turbinates  Throat: Lips, tongue normal; teeth and gums normal, normal posterior oropharynx  Neck: Supple, symmetrical  Lungs:   CTAB, Respirations unlabored, no coughing  Heart:  RRR, no murmur, Appears well perfused  Extremities: No edema  Skin: Skin color, texture, turgor normal, no rashes or lesions on visualized portions of skin  Neurologic: No gross deficits   Diagnostics: Spirometry:  Tracings reviewed. Her effort: Good reproducible efforts. FVC: 3.36L FEV1: 3.0L, 84% predicted FEV1/FVC ratio: 107% Interpretation: Spirometry consistent with normal pattern.  Please see scanned spirometry results for details.  Skin Testing: Environmental allergy panel. Adequate positive and negative controls. Results discussed with patient/family.  Airborne Adult Perc - 02/13/21 0943     Time Antigen Placed 0945    Allergen Manufacturer Waynette Buttery    Location Back    Number of Test 59    Panel 1 Select    1. Control-Buffer 50% Glycerol Negative    2. Control-Histamine 1 mg/ml 3+    3. Albumin saline Negative    4. Bahia Negative    5. French Southern Territories Negative    6. Johnson Negative    7. Kentucky Blue Negative    8. Meadow Fescue Negative    9. Perennial Rye Negative    10. Sweet Vernal Negative    11. Timothy Negative    12. Cocklebur Negative    13. Burweed Marshelder Negative    14. Ragweed, short Negative    15. Ragweed, Giant Negative    16. Plantain,  English Negative    17. Lamb's Quarters Negative    18. Sheep Sorrell Negative    19. Rough Pigweed Negative    20. Marsh Elder, Rough Negative    21. Mugwort, Common Negative    22. Ash mix Negative    23. Birch mix Negative    24. Beech American Negative    25. Box, Elder Negative    26. Cedar, red Negative    27. Cottonwood, Guinea-Bissau Negative    28. Elm mix Negative    29. Hickory Negative    30. Maple mix Negative    31. Oak, Guinea-[place] mix Negative    32. Pecan Pollen Negative    33. Pine mix  Negative    34. Sycamore Eastern Negative    35. Walnut, Black Pollen Negative    36. Alternaria alternata Negative    37. Cladosporium Herbarum Negative    38. Aspergillus mix Negative    39. Penicillium mix Negative    40. Bipolaris sorokiniana (Helminthosporium) Negative    41. Drechslera spicifera (Curvularia) Negative    42. Mucor plumbeus Negative    43. Fusarium moniliforme Negative    44. Aureobasidium pullulans (pullulara) Negative    45. Rhizopus oryzae Negative    46. Botrytis cinera Negative    47. Epicoccum nigrum Negative    48. Phoma betae Negative    49. Candida Albicans Negative  50. Trichophyton mentagrophytes Negative    51. Mite, D Farinae  5,000 AU/ml Negative    52. Mite, D Pteronyssinus  5,000 AU/ml Negative    53. Cat Hair 10,000 BAU/ml Negative    54.  Dog Epithelia Negative    55. Mixed Feathers Negative    56. Horse Epithelia Negative    57. Cockroach, German Negative    58. Mouse 3+    59. Tobacco Leaf Negative             Intradermal - 02/13/21 1102     Time Antigen Placed 1045    Allergen Manufacturer Greer    Location Arm    Number of Test 15    Control Negative    French Southern Territories Negative    Johnson Negative    7 Grass Negative    Ragweed mix Negative    Weed mix Negative    Tree mix Negative    Mold 1 Negative    Mold 2 Negative    Mold 3 Negative    Mold 4 3+    Cat Negative    Dog Negative    Cockroach Negative    Mite mix Negative    Other Omitted             Allergy testing results were read and interpreted by myself, documented by clinical staff.  Assessment and Plan   Patient Instructions  Chronic Idiopathic Urticaria: - your allergy testing today was positive to mouse and mold mix 4 (fusarium, auerobasidium, rhizopus), doubt this is related to the rash Therapy Plan-if hives return:  - start zyrtec (cetirizine) 10mg  once daily - if hives are uncontrolled, increase zyrtec (cetirizine) to 10mg  twice daily - if  hives remain uncontrolled, increase dose of zyrtec (cetirizine) to max dose of 20mg  (2 pills) twice daily- this is maximum dose - can increase or decrease dosing depending on symptom control to a maximum dose of 4 tablets of antihistamine daily. Wait until hives free for at least one month prior to decreasing dose.   - if hives are still uncontrolled with the above regimen, please arrange an appointment for discussion of Xolair (omalizumab)- an injectable medication for hives  Mild Intermittent Asthma-controlled: - Your breathing test today looked great (no obstruction) - Use Albuterol (Proair/Ventolin) 2 puffs every 4-6 hours as needed for chest tightness, wheezing, or coughing AND 2 puffs 15 minutes prior to exercise if you have symptoms with activity - please keep track of how often you are needing rescue inhaler Albuterol (Proair/Ventolin) as this will help guide future management - Asthma is not controlled if:  - Symptoms are occurring >2 times a week OR  - >2 times a month nighttime awakenings  - Please call the clinic to schedule a follow up if these symptoms arise  Follow-up as needed.  This note in its entirety was forwarded to the Provider who requested this consultation.  Thank you for your kind referral. I appreciate the opportunity to take part in Katelind's care. Please do not hesitate to contact me with questions.  Sincerely,  , MD [facility]

## 2021-02-13 ENCOUNTER — Encounter: Payer: Self-pay | Admitting: Internal Medicine

## 2021-02-13 ENCOUNTER — Ambulatory Visit (INDEPENDENT_AMBULATORY_CARE_PROVIDER_SITE_OTHER): Payer: 59 | Admitting: Internal Medicine

## 2021-02-13 ENCOUNTER — Other Ambulatory Visit: Payer: Self-pay

## 2021-02-13 VITALS — BP 110/64 | HR 86 | Temp 98.0°F | Resp 16 | Ht 67.5 in | Wt 169.0 lb

## 2021-02-13 DIAGNOSIS — L503 Dermatographic urticaria: Secondary | ICD-10-CM

## 2021-02-13 DIAGNOSIS — L500 Allergic urticaria: Secondary | ICD-10-CM | POA: Diagnosis not present

## 2021-02-13 DIAGNOSIS — J4520 Mild intermittent asthma, uncomplicated: Secondary | ICD-10-CM | POA: Insufficient documentation

## 2021-02-13 DIAGNOSIS — L501 Idiopathic urticaria: Secondary | ICD-10-CM | POA: Diagnosis not present

## 2021-02-13 MED ORDER — ALBUTEROL SULFATE HFA 108 (90 BASE) MCG/ACT IN AERS: 2.0000 | INHALATION_SPRAY | Freq: Four times a day (QID) | RESPIRATORY_TRACT | 2 refills | Status: AC | PRN

## 2021-02-13 NOTE — Patient Instructions (Signed)
Chronic Idiopathic Urticaria: - your allergy testing today was positive to mouse and mold mix 4 (fusarium, auerobasidium, rhizopus) - this is defined as hives lasting more than 6 weeks without an identifiable trigger - hives can be from a number of different sources including infections, allergies, vibration, temperature, pressure among many others other possible causes - often an identifiable cause is not determined - some potential triggers include: stress, illness, NSAIDs, aspirin, hormonal changes - approximately 50% of patients with chronic hives can have some associated swelling of the face/lips/eyelids (this is not a cause for alarm and does not typically progress onto systemic allergic reactions)  Therapy Plan-if hives return:  - start zyrtec (cetirizine) 10mg  once daily - if hives are uncontrolled, increase zyrtec (cetirizine) to 10mg  twice daily - if hives remain uncontrolled, increase dose of zyrtec (cetirizine) to max dose of 20mg  (2 pills) twice daily- this is maximum dose - can increase or decrease dosing depending on symptom control to a maximum dose of 4 tablets of antihistamine daily. Wait until hives free for at least one month prior to decreasing dose.   - if hives are still uncontrolled with the above regimen, please arrange an appointment for discussion of Xolair (omalizumab)- an injectable medication for hives  Can use one of the following in place of zyrtec if desires: Claritin (loratadine) 10 mg, Xyzal (levocetirizine) 5 mg or Allegra (fexofenadine) 180 mg daily as needed  Mild Intermittent Asthma: - Your breathing test today looked great - Use Albuterol (Proair/Ventolin) 2 puffs every 4-6 hours as needed for chest tightness, wheezing, or coughing AND 2 puffs 15 minutes prior to exercise if you have symptoms with activity - please keep track of how often you are needing rescue inhaler Albuterol (Proair/Ventolin) as this will help guide future management - Asthma is not  controlled if:  - Symptoms are occurring >2 times a week OR  - >2 times a month nighttime awakenings  - Please call the clinic to schedule a follow up if these symptoms arise   It was a pleasure meeting you in clinic today!  Follow-up as needed.     Control of Mold Allergen   Mold and fungi can grow on a variety of surfaces provided certain temperature and moisture conditions exist.  Outdoor molds grow on plants, decaying vegetation and soil.  The major outdoor mold, Alternaria and Cladosporium, are found in very high numbers during hot and dry conditions.  Generally, a late Summer - Fall peak is seen for common outdoor fungal spores.  Rain will temporarily lower outdoor mold spore count, but counts rise rapidly when the rainy period ends.  The most important indoor molds are Aspergillus and Penicillium.  Dark, humid and poorly ventilated basements are ideal sites for mold growth.  The next most common sites of mold growth are the bathroom and the kitchen.   Indoor (Perennial) Mold Control   Positive indoor molds via skin testing: Fusarium, Aureobasidium (Pullulara), and Rhizopus  Maintain humidity below 50%. Clean washable surfaces with 5% bleach solution. Remove sources e.g. contaminated carpets.

## 2021-03-03 ENCOUNTER — Other Ambulatory Visit: Payer: Self-pay | Admitting: Adult Health

## 2021-03-03 DIAGNOSIS — F411 Generalized anxiety disorder: Secondary | ICD-10-CM

## 2021-03-03 DIAGNOSIS — F909 Attention-deficit hyperactivity disorder, unspecified type: Secondary | ICD-10-CM

## 2021-03-03 NOTE — Telephone Encounter (Signed)
Pt called and needs reills on her valium 5 mg and her adderall xr 20 mg. The pharmacy does have it in stock. She has an appt on wednesday

## 2021-03-03 NOTE — Telephone Encounter (Signed)
Last filled 8/24 and 10/15

## 2021-03-05 ENCOUNTER — Telehealth (INDEPENDENT_AMBULATORY_CARE_PROVIDER_SITE_OTHER): Payer: 59 | Admitting: Adult Health

## 2021-03-05 ENCOUNTER — Encounter: Payer: Self-pay | Admitting: Adult Health

## 2021-03-05 DIAGNOSIS — F909 Attention-deficit hyperactivity disorder, unspecified type: Secondary | ICD-10-CM

## 2021-03-05 DIAGNOSIS — F4310 Post-traumatic stress disorder, unspecified: Secondary | ICD-10-CM | POA: Diagnosis not present

## 2021-03-05 DIAGNOSIS — F331 Major depressive disorder, recurrent, moderate: Secondary | ICD-10-CM | POA: Diagnosis not present

## 2021-03-05 DIAGNOSIS — F411 Generalized anxiety disorder: Secondary | ICD-10-CM | POA: Diagnosis not present

## 2021-03-05 MED ORDER — AMPHETAMINE-DEXTROAMPHET ER 20 MG PO CP24: 20.0000 mg | ORAL_CAPSULE | Freq: Two times a day (BID) | ORAL | 0 refills | Status: DC

## 2021-03-05 MED ORDER — DIAZEPAM 5 MG PO TABS: 5.0000 mg | ORAL_TABLET | Freq: Two times a day (BID) | ORAL | 2 refills | Status: DC

## 2021-03-05 NOTE — Progress Notes (Signed)
[name] [id number] 1986-01-26 35 y.o.  Virtual Visit via Video Note  I connected with pt @ on 03/05/21 at  9:40 AM EST by a video enabled telemedicine application and verified that I am speaking with the correct person using two identifiers.   I discussed the limitations of evaluation and management by telemedicine and the availability of in person appointments. The patient expressed understanding and agreed to proceed.  I discussed the assessment and treatment plan with the patient. The patient was provided an opportunity to ask questions and all were answered. The patient agreed with the plan and demonstrated an understanding of the instructions.   The patient was advised to call back or seek an in-person evaluation if the symptoms worsen or if the condition fails to improve as anticipated.  I provided 25 minutes of non-face-to-face time during this encounter.  The patient was located at home.  The provider was located at [facility].   [name], NP   Subjective:   Patient ID:  [name] is a 35 y.o. (DOB 1985-09-08) female.  Chief Complaint: No chief complaint on file.   HPI Illeana Edick presents for follow-up of MDD, GAD, PTSD, and ADHD.  Describes mood today as "ok". Pleasant. Mood symptoms - denies depression, anxiety, and irritability. Stating "I'm doing pretty good". Feels like current medications are working well for her. 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 Seeing therapist - Stevphen Meuse. Stable interest and motivation. Taking medications as prescribed.  Energy levels stable. Active, has a regular exercise routine.  Enjoys some usual interests and activities. Single. Lives alone with 26 year old son and 2 dogs. Spending time with family. Attends church.   Appetite adequate. Weight stable - 165 pounds.  Sleeps well most nights. Averages 7 to 8 hours. Focus and  concentration stable. Completing tasks. Managing aspects of household. Works full time. Denies SI or HI. Denies AH or VH.  Previous medication trials: Multiple medication trials    Review of Systems:  Review of Systems  Musculoskeletal:  Negative for gait problem.  Neurological:  Negative for tremors.  Psychiatric/Behavioral:         Please refer to HPI   Medications: I have reviewed the patient's current medications.  Current Outpatient Medications  Medication Sig Dispense Refill   [START ON 04/02/2021] amphetamine-dextroamphetamine (ADDERALL XR) 20 MG 24 hr capsule Take 1 capsule (20 mg total) by mouth 2 (two) times daily. 60 capsule 0   [START ON 04/30/2021] amphetamine-dextroamphetamine (ADDERALL XR) 20 MG 24 hr capsule Take 1 capsule (20 mg total) by mouth 2 (two) times daily. 60 capsule 0   albuterol (VENTOLIN HFA) 108 (90 Base) MCG/ACT inhaler Inhale 2 puffs into the lungs every 6 (six) hours as needed for wheezing or shortness of breath. 18 g 2   amphetamine-dextroamphetamine (ADDERALL XR) 20 MG 24 hr capsule Take 1 capsule (20 mg total) by mouth 2 (two) times daily. 60 capsule 0   Aspirin-Acetaminophen-Caffeine (GOODYS EXTRA STRENGTH PO) Take 1 packet by mouth daily as needed.     diazepam (VALIUM) 5 MG tablet Take 1 tablet (5 mg total) by mouth 2 (two) times daily. 60 tablet 2   levocetirizine (XYZAL) 5 MG tablet Take 1 tablet (5 mg total) by mouth every evening. 30 tablet 1   meclizine (ANTIVERT) 12.5 MG tablet Take 12.5 mg by mouth daily as needed for dizziness (Vertigo).     No current  facility-administered medications for this visit.    Medication Side Effects: None  Allergies: No Known Allergies  Past Medical History:  Diagnosis Date   ADHD (attention deficit hyperactivity disorder)    Angio-edema    Anxiety    Asthma    Depression    Headache    Hx of varicella    Urticaria     Family History  Problem Relation Age of Onset   ADD / ADHD Mother     Alcoholism Father    Bipolar disorder Sister    Stroke Maternal Grandmother    Anxiety disorder Maternal Grandmother    Cancer Maternal Grandfather    Stroke Maternal Grandfather    Allergic rhinitis Neg Hx    Asthma Neg Hx    Eczema Neg Hx    Urticaria Neg Hx     Social History   Socioeconomic History   Marital status: Single    Spouse name: Not on file   Number of children: Not on file   Years of education: Not on file   Highest education level: Not on file  Occupational History   Not on file  Tobacco Use   Smoking status: Former    Types: Cigarettes    Quit date: 06/25/2015    Years since quitting: 5.6   Smokeless tobacco: Never  Vaping Use   Vaping Use: Never used  Substance and Sexual Activity   Alcohol use: Never   Drug use: Never   Sexual activity: Not Currently    Birth control/protection: Abstinence  Other Topics Concern   Not on file  Social History Narrative   Not on file   Social Determinants of Health   Financial Resource Strain: Not on file  Food Insecurity: Not on file  Transportation Needs: Not on file  Physical Activity: Not on file  Stress: Not on file  Social Connections: Not on file  Intimate Partner Violence: Not on file    Past Medical History, Surgical history, Social history, and Family history were reviewed and updated as appropriate.   Please see review of systems for further details on the patient's review from today.   Objective:   Physical Exam:  There were no vitals taken for this visit.  Physical Exam Constitutional:      General: She is not in acute distress. Musculoskeletal:        General: No deformity.  Neurological:     Mental Status: She is alert and oriented to person, place, and time.     Coordination: Coordination normal.  Psychiatric:        Attention and Perception: Attention and perception normal. She does not perceive auditory or visual hallucinations.        Mood and Affect: Mood normal. Mood is not  anxious or depressed. Affect is not labile, blunt, angry or inappropriate.        Speech: Speech normal.        Behavior: Behavior normal.        Thought Content: Thought content normal. Thought content is not paranoid or delusional. Thought content does not include homicidal or suicidal ideation. Thought content does not include homicidal or suicidal plan.        Cognition and Memory: Cognition and memory normal.        Judgment: Judgment normal.     Comments: Insight intact    Lab Review:     Component Value Date/Time   NA 140 10/29/2020 0000   K 3.8 10/29/2020 0000   CL  104 10/29/2020 0000   CO2 28 10/29/2020 0000   GLUCOSE 85 10/29/2020 0000   BUN 10 10/29/2020 0000   CREATININE 0.85 10/29/2020 0000   CALCIUM 9.9 10/29/2020 0000   PROT 7.7 10/29/2020 0000   ALBUMIN 3.4 (L) 06/29/2015 1739   AST 20 10/29/2020 0000   ALT 15 10/29/2020 0000   ALKPHOS 37 (L) 06/29/2015 1739   BILITOT 0.5 10/29/2020 0000   GFRNONAA >60 06/29/2015 1739   GFRAA >60 06/29/2015 1739       Component Value Date/Time   WBC 10.4 10/29/2020 0000   RBC 4.29 10/29/2020 0000   HGB 13.7 10/29/2020 0000   HCT 40.4 10/29/2020 0000   PLT 267 10/29/2020 0000   MCV 94.2 10/29/2020 0000   MCH 31.9 10/29/2020 0000   MCHC 33.9 10/29/2020 0000   RDW 12.2 10/29/2020 0000   LYMPHSABS 2,714 10/29/2020 0000   MONOABS 1.3 (H) 06/29/2015 1739   EOSABS 42 10/29/2020 0000   BASOSABS 42 10/29/2020 0000    No results found for: POCLITH, LITHIUM   No results found for: PHENYTOIN, PHENOBARB, VALPROATE, CBMZ   .res Assessment: Plan:     Plan:  PDMP reviewed  1. Adderall XR 20mg  twice daily 2. Valium 5mg  BID  BP WNL per patient  Read and reviewed note with patient for accuracy.   RTC 6 months  Patient advised to contact office with any questions, adverse effects, or acute worsening in signs and symptoms.  Discussed potential benefits, risk, and side effects of benzodiazepines to include potential  risk of tolerance and dependence, as well as possible drowsiness.  Advised patient not to drive if experiencing drowsiness and to take lowest possible effective dose to minimize risk of dependence and tolerance.  Discussed potential benefits, risks, and side effects of stimulants with patient to include increased heart rate, palpitations, insomnia, increased anxiety, increased irritability, or decreased appetite.  Instructed patient to contact office if experiencing any significant tolerability issues.    Diagnoses and all orders for this visit:  Attention deficit hyperactivity disorder (ADHD), unspecified ADHD type -     amphetamine-dextroamphetamine (ADDERALL XR) 20 MG 24 hr capsule; Take 1 capsule (20 mg total) by mouth 2 (two) times daily. -     amphetamine-dextroamphetamine (ADDERALL XR) 20 MG 24 hr capsule; Take 1 capsule (20 mg total) by mouth 2 (two) times daily. -     amphetamine-dextroamphetamine (ADDERALL XR) 20 MG 24 hr capsule; Take 1 capsule (20 mg total) by mouth 2 (two) times daily.  Generalized anxiety disorder -     diazepam (VALIUM) 5 MG tablet; Take 1 tablet (5 mg total) by mouth 2 (two) times daily.  Major depressive disorder, recurrent episode, moderate (HCC)  PTSD (post-traumatic stress disorder)    Please see After Visit Summary for patient specific instructions.  Future Appointments  Date Time Provider Department Center  04/30/2021 12:00 PM , [facility] None    No orders of the defined types were placed in this encounter.     -------------------------------

## 2021-03-11 ENCOUNTER — Ambulatory Visit (INDEPENDENT_AMBULATORY_CARE_PROVIDER_SITE_OTHER): Payer: 59 | Admitting: Psychiatry

## 2021-03-11 DIAGNOSIS — F4310 Post-traumatic stress disorder, unspecified: Secondary | ICD-10-CM

## 2021-03-11 NOTE — Progress Notes (Signed)
      [facility] Counselor/Therapist Progress Note  Patient ID: [name], MRN: [id number],    Date: 03/11/2021  Time Spent: 57 minutes start time 3:02 PM end time 3:59 PM Virtual Visit via Video Note Connected with patient by a telemedicine/telehealth application, with their informed consent, and verified patient privacy and that I am speaking with the correct person using two identifiers. I discussed the limitations, risks, security and privacy concerns of performing psychotherapy and the availability of in person appointments. I also discussed with the patient that there may be a patient responsible charge related to this service. The patient expressed understanding and agreed to proceed. I discussed the treatment planning with the patient. The patient was provided an opportunity to ask questions and all were answered. The patient agreed with the plan and demonstrated an understanding of the instructions. The patient was advised to call  our office if  symptoms worsen or feel they are in a crisis state and need immediate contact.   Therapist Location: office Patient Location: home    Treatment Type: Individual Therapy  Reported Symptoms: anxiety, triggered responses, overwhelmed, flashbacks sadness, intrusive thoughts  Mental Status Exam:  Appearance:   Well Groomed     Behavior:  Appropriate  Motor:  Normal  Speech/Language:   Normal Rate  Affect:  Appropriate  Mood:  anxious  Thought process:  normal  Thought content:    WNL  Sensory/Perceptual disturbances:    WNL  Orientation:  oriented to person, place, time/date, and situation  Attention:  Good  Concentration:  Good  Memory:  WNL  Fund of knowledge:   Good  Insight:    Good  Judgment:   Good  Impulse Control:  Good   Risk Assessment: Danger to Self:  No Self-injurious Behavior: No Danger to Others: No Duty to Warn:no Physical Aggression / Violence:No  Access to Firearms a concern: No  Gang Involvement:No    Subjective: Met with patient via virtual session.  She was worked in for an emergency session.  She shared that her contract position is ending in November and that is stressing her.  She went on to share that there was a triggering incident at her parent's home and then her home that has been overwhelming her.  Patient did processing on her mom's behavior in front of her son, suds level 10, negative cognition "I am to be used" felt sadness in her chest and throat.  Patient was able to decrease suds level to 5.  Encouraged patient to work on self-care and find ways to set limits with her mother appropriately.  Patient was encouraged to focus on her own self-care and releasing negative emotions.  Interventions: Cognitive Behavioral Therapy, Solution-Oriented/Positive Psychology, Eye Movement Desensitization and Reprocessing (EMDR), and Insight-Oriented  Diagnosis:   ICD-10-CM   1. PTSD (post-traumatic stress disorder)  F43.10       Plan: Patient is to use CBT and coping skills to decrease triggered responses.  Patient is to work on setting limits with her mother appropriately.  Patient is to release negative emotions through exercise.  Patient is to take medication as directed. Long-term goal: Recall the traumatic event without becoming overwhelmed with negative emotions Short-term goal: Practice implement relaxation training as a coping mechanism for tension panic stress anger and anxiety  [name], [facility]

## 2021-04-01 ENCOUNTER — Ambulatory Visit (INDEPENDENT_AMBULATORY_CARE_PROVIDER_SITE_OTHER): Payer: 59 | Admitting: Psychiatry

## 2021-04-01 DIAGNOSIS — F4310 Post-traumatic stress disorder, unspecified: Secondary | ICD-10-CM | POA: Diagnosis not present

## 2021-04-01 NOTE — Progress Notes (Signed)
[facility] Counselor/Therapist Progress Note  Patient ID: [name], MRN: [id number],    Date: 04/01/2021  Time Spent: 59 minutes start time 9:02 AM end time 10:01 AM Virtual Visit via Video Note Connected with patient by a telemedicine/telehealth application, with their informed consent, and verified patient privacy and that I am speaking with the correct person using two identifiers. I discussed the limitations, risks, security and privacy concerns of performing psychotherapy and the availability of in person appointments. I also discussed with the patient that there may be a patient responsible charge related to this service. The patient expressed understanding and agreed to proceed. I discussed the treatment planning with the patient. The patient was provided an opportunity to ask questions and all were answered. The patient agreed with the plan and demonstrated an understanding of the instructions. The patient was advised to call  our office if  symptoms worsen or feel they are in a crisis state and need immediate contact.   Therapist Location: office Patient Location: parking lot at son's school    Treatment Type: Individual Therapy  Reported Symptoms: sadness, anxiety, triggered responses, crying spells, flashbacks  Mental Status Exam:  Appearance:   Well Groomed     Behavior:  Appropriate  Motor:  Normal  Speech/Language:   Normal Rate  Affect:  Appropriate  Mood:  sad  Thought process:  normal  Thought content:    WNL  Sensory/Perceptual disturbances:    WNL  Orientation:  oriented to person, place, time/date, and situation  Attention:  Good  Concentration:  Good  Memory:  WNL  Fund of knowledge:   Good  Insight:    Good  Judgment:   Good  Impulse Control:  Good   Risk Assessment: Danger to Self:  No Self-injurious Behavior: No Danger to Others: No Duty to Warn:no Physical Aggression / Violence:No  Access to Firearms a concern: No  Gang Involvement:No    Subjective: Met with patient via virtual session.  She had been worked in for an emergency session. She shared that there have been more issues with her family.  She explained the situation that was triggered when her sister tried to have a meeting with her mother and patient.  Patient was allowed time to just discuss what seem to surface in the whole dynamic.  Patient was able to acknowledge that she feels very alone due to being a single mother and her own mother not being able to be there for her or her sister.  Patient was encouraged to recognize that her mother is an addict and that is going to make it very difficult for her to be present the way she wants her to.  She will have to affirm herself regularly and to recognize that she is raising her child differently than her mother and sister and so their feedback with parenting will not be helpful for her.  She was encouraged to use some CBT filters when interacting with them so that she does not take things personal.  Patient explained she had another CPS report made against her and CPS came out saw that it was not true again and close the case.  Patient stated it is just very disturbing to not know when that is going to happen again even though the charges/allegations are never true and cases are closed fairly quickly because of that information.  Patient explained that this time the DSS worker was told that her child was not fat there was no food  in the house he did not have close and there was glass all over the floor so it was very easy for them to see that none of those accusations were true.  Patient was encouraged to recognize that she has to be proactive about her self-care and to make sure she is releasing emotions from her body on a daily basis especially with the ups and downs with her family.  She was also encouraged to maintain distance especially from her mother.  Patient explained that she is also going through a job change and that is  creating anxiety as well.  Agreed to try and work patient in for sessions as much is possible over this difficult month.  Interventions: Cognitive Behavioral Therapy, Solution-Oriented/Positive Psychology, and Insight-Oriented  Diagnosis:   ICD-10-CM   1. PTSD (post-traumatic stress disorder)  F43.10       Plan: Patient is to use CBT and coping skills to decrease triggered responses.  Patient is to exercise to release negative emotions appropriately.  Patient is to maintain distance from her family especially her mother at this time.  Patient is to work on affirming herself regularly that she is enough.  Patient is to take medication as directed. Long-term goal: Recall the traumatic event without becoming overwhelmed with negative emotions Short-term goal: Practice implement relaxation training as a coping mechanism for tension panic stress anger and anxiety  [name], [facility]

## 2021-04-11 ENCOUNTER — Telehealth: Payer: 59 | Admitting: Emergency Medicine

## 2021-04-11 ENCOUNTER — Telehealth (INDEPENDENT_AMBULATORY_CARE_PROVIDER_SITE_OTHER): Payer: 59 | Admitting: Medical-Surgical

## 2021-04-11 DIAGNOSIS — U071 COVID-19: Secondary | ICD-10-CM

## 2021-04-11 DIAGNOSIS — Z91199 Patient's noncompliance with other medical treatment and regimen due to unspecified reason: Secondary | ICD-10-CM

## 2021-04-11 MED ORDER — FLUTICASONE PROPIONATE 50 MCG/ACT NA SUSP: 2.0000 | Freq: Every day | NASAL | 0 refills | Status: AC

## 2021-04-11 NOTE — Patient Instructions (Signed)
Rob Hickman, thank you for joining Cathlyn Parsons, NP for today's virtual visit.  While this provider is not your primary care provider (PCP), if your PCP is located in our provider database this encounter information will be shared with them immediately following your visit.  Consent: (Patient) [name] provided verbal consent for this virtual visit at the beginning of the encounter.  Current Medications:  Current Outpatient Medications:    fluticasone (FLONASE) 50 MCG/ACT nasal spray, Place 2 sprays into both nostrils daily., Disp: 16 g, Rfl: 0   albuterol (VENTOLIN HFA) 108 (90 Base) MCG/ACT inhaler, Inhale 2 puffs into the lungs every 6 (six) hours as needed for wheezing or shortness of breath., Disp: 18 g, Rfl: 2   amphetamine-dextroamphetamine (ADDERALL XR) 20 MG 24 hr capsule, Take 1 capsule (20 mg total) by mouth 2 (two) times daily., Disp: 60 capsule, Rfl: 0   amphetamine-dextroamphetamine (ADDERALL XR) 20 MG 24 hr capsule, Take 1 capsule (20 mg total) by mouth 2 (two) times daily., Disp: 60 capsule, Rfl: 0   [START ON 04/30/2021] amphetamine-dextroamphetamine (ADDERALL XR) 20 MG 24 hr capsule, Take 1 capsule (20 mg total) by mouth 2 (two) times daily., Disp: 60 capsule, Rfl: 0   Aspirin-Acetaminophen-Caffeine (GOODYS EXTRA STRENGTH PO), Take 1 packet by mouth daily as needed., Disp: , Rfl:    diazepam (VALIUM) 5 MG tablet, Take 1 tablet (5 mg total) by mouth 2 (two) times daily., Disp: 60 tablet, Rfl: 2   levocetirizine (XYZAL) 5 MG tablet, Take 1 tablet (5 mg total) by mouth every evening., Disp: 30 tablet, Rfl: 1   meclizine (ANTIVERT) 12.5 MG tablet, Take 12.5 mg by mouth daily as needed for dizziness (Vertigo)., Disp: , Rfl:    Medications ordered in this encounter:  Meds ordered this encounter  Medications   fluticasone (FLONASE) 50 MCG/ACT nasal spray    Sig: Place 2 sprays into both nostrils daily.    Dispense:  16 g    Refill:  0     *If you need refills on other  medications prior to your next appointment, please contact your pharmacy*  Follow-Up: Call back or seek an in-person evaluation if the symptoms worsen or if the condition fails to improve as anticipated.  Other Instructions Use saline nasal spray several times a day to help with congestion. Also try Mucinex (generic guaifenesin is okay to use.  Other things to help relieve congestion are taking steamy showers, hot herbal tea with honey and lemon, Vicks vapor rub.  Please keep well-hydrated and get plenty of rest.  You were to quarantine for 5 days from onset of your symptoms.  After day 5, if you have had no fever and you are feeling better, you can end quarantine but need to mask for an additional 5 days. After day 5 if you have a fever or are having significant symptoms, please quarantine for full 10 days.  If you note any worsening of symptoms, any significant shortness of breath or any chest pain, please seek ER evaluation ASAP.  Please do not delay care!  COVID-19: What to Do if You Are Sick If you test positive and are an older adult or someone who is at high risk of getting very sick from COVID-19, treatment may be available. Contact a healthcare provider right away after a positive test to determine if you are eligible, even if your symptoms are mild right now. You can also visit a Test to Treat location and, if eligible, receive a  prescription from a provider. Don't delay: Treatment must be started within the first few days to be effective. If you have a fever, cough, or other symptoms, you might have COVID-19. Most people have mild illness and are able to recover at home. If you are sick: Keep track of your symptoms. If you have an emergency warning sign (including trouble breathing), call 911. Steps to help prevent the spread of COVID-19 if you are sick If you are sick with COVID-19 or think you might have COVID-19, follow the steps below to care for yourself and to help protect other  people in your home and community. Stay home except to get medical care Stay home. Most people with COVID-19 have mild illness and can recover at home without medical care. Do not leave your home, except to get medical care. Do not visit public areas and do not go to places where you are unable to wear a mask. Take care of yourself. Get rest and stay hydrated. Take over-the-counter medicines, such as acetaminophen, to help you feel better. Stay in touch with your doctor. Call before you get medical care. Be sure to get care if you have trouble breathing, or have any other emergency warning signs, or if you think it is an emergency. Avoid public transportation, ride-sharing, or taxis if possible. Get tested If you have symptoms of COVID-19, get tested. While waiting for test results, stay away from others, including staying apart from those living in your household. Get tested as soon as possible after your symptoms start. Treatments may be available for people with COVID-19 who are at risk for becoming very sick. Don't delay: Treatment must be started early to be effective--some treatments must begin within 5 days of your first symptoms. Contact your healthcare provider right away if your test result is positive to determine if you are eligible. Self-tests are one of several options for testing for the virus that causes COVID-19 and may be more convenient than laboratory-based tests and point-of-care tests. Ask your healthcare provider or your local health department if you need help interpreting your test results. You can visit your state, tribal, local, and territorial health department's website to look for the latest local information on testing sites. Separate yourself from other people As much as possible, stay in a specific room and away from other people and pets in your home. If possible, you should use a separate bathroom. If you need to be around other people or animals in or outside of the  home, wear a well-fitting mask. Tell your close contacts that they may have been exposed to COVID-19. An infected person can spread COVID-19 starting 48 hours (or 2 days) before the person has any symptoms or tests positive. By letting your close contacts know they may have been exposed to COVID-19, you are helping to protect everyone. See COVID-19 and Animals if you have questions about pets. If you are diagnosed with COVID-19, someone from the health department may call you. Answer the call to slow the spread. Monitor your symptoms Symptoms of COVID-19 include fever, cough, or other symptoms. Follow care instructions from your healthcare provider and local health department. Your local health authorities may give instructions on checking your symptoms and reporting information. When to seek emergency medical attention Look for emergency warning signs* for COVID-19. If someone is showing any of these signs, seek emergency medical care immediately: Trouble breathing Persistent pain or pressure in the chest New confusion Inability to wake or stay awake Pale, gray,  or blue-colored skin, lips, or nail beds, depending on skin tone *This list is not all possible symptoms. Please call your medical provider for any other symptoms that are severe or concerning to you. Call 911 or call ahead to your local emergency facility: Notify the operator that you are seeking care for someone who has or may have COVID-19. Call ahead before visiting your doctor Call ahead. Many medical visits for routine care are being postponed or done by phone or telemedicine. If you have a medical appointment that cannot be postponed, call your doctor's office, and tell them you have or may have COVID-19. This will help the office protect themselves and other patients. If you are sick, wear a well-fitting mask You should wear a mask if you must be around other people or animals, including pets (even at home). Wear a mask with the  best fit, protection, and comfort for you. You don't need to wear the mask if you are alone. If you can't put on a mask (because of trouble breathing, for example), cover your coughs and sneezes in some other way. Try to stay at least 6 feet away from other people. This will help protect the people around you. Masks should not be placed on young children under age 55 years, anyone who has trouble breathing, or anyone who is not able to remove the mask without help. Cover your coughs and sneezes Cover your mouth and nose with a tissue when you cough or sneeze. Throw away used tissues in a lined trash can. Immediately wash your hands with soap and water for at least 20 seconds. If soap and water are not available, clean your hands with an alcohol-based hand sanitizer that contains at least 60% alcohol. Clean your hands often Wash your hands often with soap and water for at least 20 seconds. This is especially important after blowing your nose, coughing, or sneezing; going to the bathroom; and before eating or preparing food. Use hand sanitizer if soap and water are not available. Use an alcohol-based hand sanitizer with at least 60% alcohol, covering all surfaces of your hands and rubbing them together until they feel dry. Soap and water are the best option, especially if hands are visibly dirty. Avoid touching your eyes, nose, and mouth with unwashed hands. Handwashing Tips Avoid sharing personal household items Do not share dishes, drinking glasses, cups, eating utensils, towels, or bedding with other people in your home. Wash these items thoroughly after using them with soap and water or put in the dishwasher. Clean surfaces in your home regularly Clean and disinfect high-touch surfaces (for example, doorknobs, tables, handles, light switches, and countertops) in your "sick room" and bathroom. In shared spaces, you should clean and disinfect surfaces and items after each use by the person who is  ill. If you are sick and cannot clean, a caregiver or other person should only clean and disinfect the area around you (such as your bedroom and bathroom) on an as needed basis. Your caregiver/other person should wait as long as possible (at least several hours) and wear a mask before entering, cleaning, and disinfecting shared spaces that you use. Clean and disinfect areas that may have blood, stool, or body fluids on them. Use household cleaners and disinfectants. Clean visible dirty surfaces with household cleaners containing soap or detergent. Then, use a household disinfectant. Use a product from [employer] List N: Disinfectants for Coronavirus (COVID-19). Be sure to follow the instructions on the label to ensure safe and effective  use of the product. Many products recommend keeping the surface wet with a disinfectant for a certain period of time (look at "contact time" on the product label). You may also need to wear personal protective equipment, such as gloves, depending on the directions on the product label. Immediately after disinfecting, wash your hands with soap and water for 20 seconds. For completed guidance on cleaning and disinfecting your home, visit Complete Disinfection Guidance. Take steps to improve ventilation at home Improve ventilation (air flow) at home to help prevent from spreading COVID-19 to other people in your household. Clear out COVID-19 virus particles in the air by opening windows, using air filters, and turning on fans in your home. Use this interactive tool to learn how to improve air flow in your home. When you can be around others after being sick with COVID-19 Deciding when you can be around others is different for different situations. Find out when you can safely end home isolation. For any additional questions about your care, contact your healthcare provider or state or local health department. 07/09/2020 Content source: [facility] ([facility]), Division of Viral Diseases This information is not intended to replace advice given to you by your health care provider. Make sure you discuss any questions you have with your health care provider. Document Revised: 08/22/2020 Document Reviewed: 08/22/2020 Elsevier Patient Education  2022 [employer].       If you have been instructed to have an in-person evaluation today at a local Urgent Care facility, please use the link below. It will take you to a list of all of our available [facility], including address, phone number and hours of operation. Please do not delay care.  [facility]  If you or a family member do not have a primary care provider, use the link below to schedule a visit and establish care. When you choose a [facility] primary care physician or advanced practice provider, you gain a long-term partner in health. Find a Primary Care Provider  Learn more about [facility]'s in-office and virtual care options:  - Get Care Now

## 2021-04-11 NOTE — Progress Notes (Signed)
Patient called at 0930 for check in but no answer. No voicemail left. Direct links to MyChart visit sent via text and email. Provider log in at [phone]. Patient not present so direct text link sent to phone number on file. Provider waited in virtual room for 10 minutes but patient did not join visit. Marked as a no show for appointment. If still needing to be seen, will need to reschedule.   ___________________________________________ Thayer Ohm, DNP, APRN, FNP-BC Primary Care and Sports Medicine [facility]

## 2021-04-11 NOTE — Progress Notes (Signed)
Virtual Visit Consent   Caroline Longie, you are scheduled for a virtual visit with a [facility] provider today.     Just as with appointments in the office, your consent must be obtained to participate.  Your consent will be active for this visit and any virtual visit you may have with one of our providers in the next 365 days.     If you have a MyChart account, a copy of this consent can be sent to you electronically.  All virtual visits are billed to your insurance company just like a traditional visit in the office.    As this is a virtual visit, video technology does not allow for your provider to perform a traditional examination.  This may limit your provider's ability to fully assess your condition.  If your provider identifies any concerns that need to be evaluated in person or the need to arrange testing (such as labs, EKG, etc.), we will make arrangements to do so.     Although advances in technology are sophisticated, we cannot ensure that it will always work on either your end or our end.  If the connection with a video visit is poor, the visit may have to be switched to a telephone visit.  With either a video or telephone visit, we are not always able to ensure that we have a secure connection.     I need to obtain your verbal consent now.   Are you willing to proceed with your visit today?    [name] has provided verbal consent on 04/11/2021 for a virtual visit (video or telephone).   Cathlyn Parsons, NP   Date: 04/11/2021 11:59 AM   Virtual Visit via Video Note   I, Cathlyn Parsons, connected with  [name]  ([id number], Apr 16, 1986) on 04/11/21 at 11:45 AM EST by a video-enabled telemedicine application and verified that I am speaking with the correct person using two identifiers.  Location: Patient: Virtual Visit Location Patient: Home Provider: Virtual Visit Location Provider: Home Office   I discussed the limitations of evaluation and management by telemedicine and  the availability of in person appointments. The patient expressed understanding and agreed to proceed.    History of Present Illness: [name] is a 35 y.o. who identifies as a female who was assigned female at birth, and is being seen today for COVID.  She tested positive for COVID last night.  Her son got COVID from school and now she and her mother and her son all have COVID.  Symptoms started 2 days ago with headache pressure, feeling tired.  She denies shortness of breath or wheezing or difficulty breathing.  She denies fever.  She does report some mild nasal congestion and she does feel a lot of pressure in her ears.  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   PTSD (post-traumatic stress disorder) 08/23/2019   Aggressive behavior 08/23/2019   Involuntary commitment 08/23/2019   Adjustment disorder with mixed anxiety and depressed mood 07/26/2019   ADHD (attention deficit hyperactivity disorder), combined type 07/15/2017   Vaginal delivery 12/28/2015   Anxiety 12/25/2015   Depression 12/25/2015   Positive GBS test 12/25/2015   Post-dates pregnancy 12/25/2015   Smoker 06/30/2015   Hx of ovarian cyst 06/30/2015   History of ADHD 06/30/2015   Bilateral sensorineural hearing loss 12/13/2012  Otalgia 12/13/2012   Tinnitus 12/13/2012   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    Allergies: No Known Allergies Medications:  Current Outpatient Medications:    fluticasone (FLONASE) 50 MCG/ACT nasal spray, Place 2 sprays into both nostrils daily., Disp: 16 g, Rfl: 0   albuterol (VENTOLIN HFA) 108 (90 Base) MCG/ACT inhaler, Inhale 2 puffs into the lungs every 6 (six) hours as needed for wheezing or shortness of breath., Disp: 18 g, Rfl: 2   amphetamine-dextroamphetamine (ADDERALL XR) 20 MG 24 hr capsule, Take 1 capsule (20 mg total) by mouth  2 (two) times daily., Disp: 60 capsule, Rfl: 0   amphetamine-dextroamphetamine (ADDERALL XR) 20 MG 24 hr capsule, Take 1 capsule (20 mg total) by mouth 2 (two) times daily., Disp: 60 capsule, Rfl: 0   [START ON 04/30/2021] amphetamine-dextroamphetamine (ADDERALL XR) 20 MG 24 hr capsule, Take 1 capsule (20 mg total) by mouth 2 (two) times daily., Disp: 60 capsule, Rfl: 0   Aspirin-Acetaminophen-Caffeine (GOODYS EXTRA STRENGTH PO), Take 1 packet by mouth daily as needed., Disp: , Rfl:    diazepam (VALIUM) 5 MG tablet, Take 1 tablet (5 mg total) by mouth 2 (two) times daily., Disp: 60 tablet, Rfl: 2   levocetirizine (XYZAL) 5 MG tablet, Take 1 tablet (5 mg total) by mouth every evening., Disp: 30 tablet, Rfl: 1   meclizine (ANTIVERT) 12.5 MG tablet, Take 12.5 mg by mouth daily as needed for dizziness (Vertigo)., Disp: , Rfl:   Observations/Objective: Patient is well-developed, well-nourished in no acute distress.  Resting comfortably  at home.  Head is normocephalic, atraumatic.  No labored breathing.  Speech is clear and coherent with logical content.  Patient is alert and oriented at baseline.    Assessment and Plan: 1. COVID-19  I prescribed Flonase nasal spray.  I reviewed supportive care measures.  Reviewed reasons for seeking a higher level of care.  Follow Up Instructions: I discussed the assessment and treatment plan with the patient. The patient was provided an opportunity to ask questions and all were answered. The patient agreed with the plan and demonstrated an understanding of the instructions.  A copy of instructions were sent to the patient via MyChart unless otherwise noted below.    The patient was advised to call back or seek an in-person evaluation if the symptoms worsen or if the condition fails to improve as anticipated.  Time:  I spent 13 minutes with the patient via telehealth technology discussing the above problems/concerns.    Cathlyn Parsons, NP

## 2021-04-30 ENCOUNTER — Ambulatory Visit: Payer: 59 | Admitting: Psychiatry

## 2021-05-02 ENCOUNTER — Telehealth: Payer: Self-pay | Admitting: Adult Health

## 2021-05-02 NOTE — Telephone Encounter (Signed)
Patient called in stating that insurance needs PA. Please rtc to discuss [phone]

## 2021-05-05 NOTE — Telephone Encounter (Signed)
Called patient and let her know that her PA for Adderall has been submitted and we are just waiting for their response.

## 2021-05-05 NOTE — Telephone Encounter (Signed)
Addendum to previous message on 05/02/21 from patient. Brynlynn called and gave out of her Adderall 20 mg on Thursday and is wanting to know about the status of her prior authorization. Could someone please call her at [phone]. She is upset that she is out of her Adderall and doesn't have anymore.

## 2021-05-05 NOTE — Telephone Encounter (Signed)
PA submitted.

## 2021-05-06 NOTE — Telephone Encounter (Signed)
Thank you. Will let patient know

## 2021-05-06 NOTE — Telephone Encounter (Signed)
I already received an approval yesterday. I called them and spoke with the pharmacist, not sure if there were different people working on it.  It's approved effective 05/05/2021-05/05/2022 for #60/30 day supply Amphetamine-dextroamphetamine ER 20 mg

## 2021-06-30 ENCOUNTER — Other Ambulatory Visit: Payer: Self-pay | Admitting: Adult Health

## 2021-06-30 DIAGNOSIS — F909 Attention-deficit hyperactivity disorder, unspecified type: Secondary | ICD-10-CM

## 2021-07-01 NOTE — Telephone Encounter (Signed)
Pt would like refill of Adderall sent to [facility].  It is available there. ? ?No upcoming appts scheduled. ?

## 2021-07-31 ENCOUNTER — Other Ambulatory Visit: Payer: Self-pay | Admitting: Adult Health

## 2021-07-31 DIAGNOSIS — F909 Attention-deficit hyperactivity disorder, unspecified type: Secondary | ICD-10-CM

## 2021-07-31 MED ORDER — AMPHETAMINE-DEXTROAMPHET ER 20 MG PO CP24: 20.0000 mg | ORAL_CAPSULE | Freq: Two times a day (BID) | ORAL | 0 refills | Status: DC

## 2021-07-31 NOTE — Telephone Encounter (Signed)
Called pharmacy and they do not have in stock. Canceled Rx.  ?

## 2021-07-31 NOTE — Telephone Encounter (Signed)
LVM to [facility] 

## 2021-07-31 NOTE — Telephone Encounter (Signed)
Patient [name], told her pharmacy does not have medication in stock. She will call around and try to find and let me know.  ?

## 2021-08-29 ENCOUNTER — Telehealth: Payer: Self-pay | Admitting: Adult Health

## 2021-08-29 ENCOUNTER — Other Ambulatory Visit: Payer: Self-pay

## 2021-08-29 DIAGNOSIS — F909 Attention-deficit hyperactivity disorder, unspecified type: Secondary | ICD-10-CM

## 2021-08-29 MED ORDER — AMPHETAMINE-DEXTROAMPHET ER 20 MG PO CP24: 20.0000 mg | ORAL_CAPSULE | Freq: Two times a day (BID) | ORAL | 0 refills | Status: DC

## 2021-08-29 NOTE — Telephone Encounter (Signed)
Pended.

## 2021-08-29 NOTE — Telephone Encounter (Signed)
Patient called in for refill on Adderall 20mg . States she has already checked with pharmacy and they do have in stock. Ph: [phone]. Pharmacy [facility] [address] [place].  ?

## 2021-08-31 ENCOUNTER — Telehealth: Payer: Self-pay

## 2021-08-31 NOTE — Telephone Encounter (Addendum)
Prior Authorization Initiated ?Amphetamine-Dextroamphet ER 20MG  er capsules ?[employer] ?PA Case ID: [id number]  ? ?Approval received ?08/31/21 - 08/31/22  ? ? ?

## 2021-09-24 ENCOUNTER — Ambulatory Visit (INDEPENDENT_AMBULATORY_CARE_PROVIDER_SITE_OTHER): Payer: Self-pay | Admitting: Psychiatry

## 2021-09-24 DIAGNOSIS — F4310 Post-traumatic stress disorder, unspecified: Secondary | ICD-10-CM
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
## 2021-09-26 ENCOUNTER — Other Ambulatory Visit: Payer: Self-pay | Admitting: Adult Health

## 2021-09-26 DIAGNOSIS — F909 Attention-deficit hyperactivity disorder, unspecified type: Secondary | ICD-10-CM

## 2021-09-26 NOTE — Telephone Encounter (Signed)
Filled 5/15 due 6/12

## 2021-09-26 NOTE — Telephone Encounter (Signed)
Next visit is 10/15/21. Requesting refill on Adderall 20 mg called to:  [facility] [place], [place] - [address] [place] [address]  Phone:  [phone]  Fax:  [phone]    Patient states pharmacy has 20 mg in stock.

## 2021-09-29 ENCOUNTER — Telehealth: Payer: Self-pay | Admitting: Adult Health

## 2021-09-29 ENCOUNTER — Ambulatory Visit (INDEPENDENT_AMBULATORY_CARE_PROVIDER_SITE_OTHER): Payer: Self-pay | Admitting: Psychiatry

## 2021-09-29 ENCOUNTER — Other Ambulatory Visit: Payer: Self-pay

## 2021-09-29 DIAGNOSIS — F4310 Post-traumatic stress disorder, unspecified: Secondary | ICD-10-CM

## 2021-09-29 DIAGNOSIS — F411 Generalized anxiety disorder: Secondary | ICD-10-CM

## 2021-09-29 DIAGNOSIS — F909 Attention-deficit hyperactivity disorder, unspecified type: Secondary | ICD-10-CM

## 2021-09-29 MED ORDER — AMPHETAMINE-DEXTROAMPHET ER 20 MG PO CP24: 20.0000 mg | ORAL_CAPSULE | Freq: Two times a day (BID) | ORAL | 0 refills | Status: DC

## 2021-09-29 MED ORDER — DIAZEPAM 5 MG PO TABS: 5.0000 mg | ORAL_TABLET | Freq: Two times a day (BID) | ORAL | 0 refills | Status: DC

## 2021-09-29 NOTE — Telephone Encounter (Signed)
Pended.
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
## 2021-09-29 NOTE — Telephone Encounter (Signed)
Pt requesting RF for Diazepam 5 mg 2/d  @ different pharmacy [facility]. [address]

## 2021-10-09 ENCOUNTER — Ambulatory Visit (INDEPENDENT_AMBULATORY_CARE_PROVIDER_SITE_OTHER): Payer: Self-pay | Admitting: Psychiatry

## 2021-10-09 DIAGNOSIS — F4310 Post-traumatic stress disorder, unspecified: Secondary | ICD-10-CM
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
## 2021-10-15 ENCOUNTER — Encounter: Payer: Self-pay | Admitting: Adult Health

## 2021-10-15 ENCOUNTER — Telehealth (INDEPENDENT_AMBULATORY_CARE_PROVIDER_SITE_OTHER): Payer: Self-pay | Admitting: Adult Health

## 2021-10-15 DIAGNOSIS — F4323 Adjustment disorder with mixed anxiety and depressed mood: Secondary | ICD-10-CM

## 2021-10-15 DIAGNOSIS — F411 Generalized anxiety disorder: Secondary | ICD-10-CM

## 2021-10-15 DIAGNOSIS — F902 Attention-deficit hyperactivity disorder, combined type: Secondary | ICD-10-CM

## 2021-10-15 DIAGNOSIS — F4310 Post-traumatic stress disorder, unspecified: Secondary | ICD-10-CM

## 2021-10-15 DIAGNOSIS — F909 Attention-deficit hyperactivity disorder, unspecified type: Secondary | ICD-10-CM

## 2021-10-15 DIAGNOSIS — F324 Major depressive disorder, single episode, in partial remission: Secondary | ICD-10-CM

## 2021-10-15 MED ORDER — DIAZEPAM 5 MG PO TABS: 5.0000 mg | ORAL_TABLET | Freq: Two times a day (BID) | ORAL | 2 refills | Status: AC

## 2021-10-15 MED ORDER — AMPHETAMINE-DEXTROAMPHET ER 20 MG PO CP24: 20.0000 mg | ORAL_CAPSULE | Freq: Two times a day (BID) | ORAL | 0 refills | Status: DC

## 2021-10-15 MED ORDER — AMPHETAMINE-DEXTROAMPHET ER 20 MG PO CP24: 20.0000 mg | ORAL_CAPSULE | Freq: Two times a day (BID) | ORAL | 0 refills | Status: AC

## 2021-10-15 NOTE — Progress Notes (Signed)
[name] [id number] [birth year] 36 y.o.  Virtual Visit via Video Note  I connected with pt @ on 10/15/21 at  3:00 PM EDT by a video enabled telemedicine application and verified that I am speaking with the correct person using two identifiers.   I discussed the limitations of evaluation and management by telemedicine and the availability of in person appointments. The patient expressed understanding and agreed to proceed.  I discussed the assessment and treatment plan with the patient. The patient was provided an opportunity to ask questions and all were answered. The patient agreed with the plan and demonstrated an understanding of the instructions.   The patient was advised to call back or seek an in-person evaluation if the symptoms worsen or if the condition fails to improve as anticipated.  I provided 25 minutes of non-face-to-face time during this encounter.  The patient was located at home.  The provider was located at [facility].   [name], NP   Subjective:   Patient ID:  [name] is a 36 y.o. (DOB 1985/10/13) female.  Chief Complaint: No chief complaint on file.   HPI [name] presents for follow-up of MDD, GAD, PTSD, and ADHD.  Describes mood today as "ok". Pleasant. Mood symptoms - denies depression, anxiety, and irritability. Stating "I'm doing good". Mood is consistent. Stating "I'm doing good". Feels like current medications are working well for her.  Upcoming move to [place] - "looking forward to the change" Seeing therapist - Stevphen Meuse. Stable interest and motivation. Taking medications as prescribed.  Energy levels stable. Active, has a regular exercise routine.  Enjoys some usual interests and activities. Single. Lives alone with 60 year old son and 2 dogs and 2 cats. Appetite adequate. Weight stable - 165 pounds.  Sleeps well most nights. Averages 7 to 8 hours. Focus and concentration stable. Completing tasks. Managing aspects of  household. Works full time. Denies SI or HI. Denies AH or VH.  Previous medication trials: Multiple medication trials    Review of Systems:  Review of Systems  Musculoskeletal:  Negative for gait problem.  Neurological:  Negative for tremors.  Psychiatric/Behavioral:         Please refer to HPI    Medications: I have reviewed the patient's current medications.  Current Outpatient Medications  Medication Sig Dispense Refill   albuterol (VENTOLIN HFA) 108 (90 Base) MCG/ACT inhaler Inhale 2 puffs into the lungs every 6 (six) hours as needed for wheezing or shortness of breath. 18 g 2   amphetamine-dextroamphetamine (ADDERALL XR) 20 MG 24 hr capsule Take 1 capsule (20 mg total) by mouth 2 (two) times daily. 60 capsule 0   amphetamine-dextroamphetamine (ADDERALL XR) 20 MG 24 hr capsule Take 1 capsule (20 mg total) by mouth 2 (two) times daily. 60 capsule 0   [START ON 11/12/2021] amphetamine-dextroamphetamine (ADDERALL XR) 20 MG 24 hr capsule Take 1 capsule (20 mg total) by mouth 2 (two) times daily. 60 capsule 0   Aspirin-Acetaminophen-Caffeine (GOODYS EXTRA STRENGTH PO) Take 1 packet by mouth daily as needed.     diazepam (VALIUM) 5 MG tablet Take 1 tablet (5 mg total) by mouth 2 (two) times daily. 60 tablet 2   fluticasone (FLONASE) 50 MCG/ACT nasal spray Place 2 sprays into both nostrils daily. 16 g 0   levocetirizine (XYZAL) 5 MG tablet Take 1 tablet (5 mg total) by mouth every evening. 30 tablet 1   meclizine (ANTIVERT) 12.5 MG tablet Take 12.5 mg by mouth daily as needed  for dizziness (Vertigo).     No current facility-administered medications for this visit.    Medication Side Effects: None  Allergies: No Known Allergies  Past Medical History:  Diagnosis Date   ADHD (attention deficit hyperactivity disorder)    Angio-edema    Anxiety    Asthma    Depression    Headache    Hx of varicella    Urticaria     Family History  Problem Relation Age of Onset   ADD / ADHD  Mother    Alcoholism Father    Bipolar disorder Sister    Stroke Maternal Grandmother    Anxiety disorder Maternal Grandmother    Cancer Maternal Grandfather    Stroke Maternal Grandfather    Allergic rhinitis Neg Hx    Asthma Neg Hx    Eczema Neg Hx    Urticaria Neg Hx     Social History   Socioeconomic History   Marital status: Single    Spouse name: Not on file   Number of children: Not on file   Years of education: Not on file   Highest education level: Not on file  Occupational History   Not on file  Tobacco Use   Smoking status: Former    Types: Cigarettes    Quit date: 06/25/2015    Years since quitting: 6.3   Smokeless tobacco: Never  Vaping Use   Vaping Use: Never used  Substance and Sexual Activity   Alcohol use: Never   Drug use: Never   Sexual activity: Not Currently    Birth control/protection: Abstinence  Other Topics Concern   Not on file  Social History Narrative   Not on file   Social Determinants of Health   Financial Resource Strain: Not on file  Food Insecurity: Not on file  Transportation Needs: Not on file  Physical Activity: Not on file  Stress: Not on file  Social Connections: Not on file  Intimate Partner Violence: Not on file    Past Medical History, Surgical history, Social history, and Family history were reviewed and updated as appropriate.   Please see review of systems for further details on the patient's review from today.   Objective:   Physical Exam:  There were no vitals taken for this visit.  Physical Exam Constitutional:      General: She is not in acute distress. Musculoskeletal:        General: No deformity.  Neurological:     Mental Status: She is alert and oriented to person, place, and time.     Coordination: Coordination normal.  Psychiatric:        Attention and Perception: Attention and perception normal. She does not perceive auditory or visual hallucinations.        Mood and Affect: Mood normal. Mood  is not anxious or depressed. Affect is not labile, blunt, angry or inappropriate.        Speech: Speech normal.        Behavior: Behavior normal.        Thought Content: Thought content normal. Thought content is not paranoid or delusional. Thought content does not include homicidal or suicidal ideation. Thought content does not include homicidal or suicidal plan.        Cognition and Memory: Cognition and memory normal.        Judgment: Judgment normal.     Comments: Insight intact     Lab Review:     Component Value Date/Time   NA 140 10/29/2020  0000   K 3.8 10/29/2020 0000   CL 104 10/29/2020 0000   CO2 28 10/29/2020 0000   GLUCOSE 85 10/29/2020 0000   BUN 10 10/29/2020 0000   CREATININE 0.85 10/29/2020 0000   CALCIUM 9.9 10/29/2020 0000   PROT 7.7 10/29/2020 0000   ALBUMIN 3.4 (L) 06/29/2015 1739   AST 20 10/29/2020 0000   ALT 15 10/29/2020 0000   ALKPHOS 37 (L) 06/29/2015 1739   BILITOT 0.5 10/29/2020 0000   GFRNONAA >60 06/29/2015 1739   GFRAA >60 06/29/2015 1739       Component Value Date/Time   WBC 10.4 10/29/2020 0000   RBC 4.29 10/29/2020 0000   HGB 13.7 10/29/2020 0000   HCT 40.4 10/29/2020 0000   PLT 267 10/29/2020 0000   MCV 94.2 10/29/2020 0000   MCH 31.9 10/29/2020 0000   MCHC 33.9 10/29/2020 0000   RDW 12.2 10/29/2020 0000   LYMPHSABS 2,714 10/29/2020 0000   MONOABS 1.3 (H) 06/29/2015 1739   EOSABS 42 10/29/2020 0000   BASOSABS 42 10/29/2020 0000    No results found for: "POCLITH", "LITHIUM"   No results found for: "PHENYTOIN", "PHENOBARB", "VALPROATE", "CBMZ"   .res Assessment: Plan:    Plan:  PDMP reviewed  1. Adderall XR 20mg  twice daily 2. Valium 5mg  BID  BP WNL per patient  Read and reviewed note with patient for accuracy.   RTC 6 months  Patient advised to contact office with any questions, adverse effects, or acute worsening in signs and symptoms.  Discussed potential benefits, risk, and side effects of benzodiazepines to  include potential risk of tolerance and dependence, as well as possible drowsiness.  Advised patient not to drive if experiencing drowsiness and to take lowest possible effective dose to minimize risk of dependence and tolerance.  Discussed potential benefits, risks, and side effects of stimulants with patient to include increased heart rate, palpitations, insomnia, increased anxiety, increased irritability, or decreased appetite.  Instructed patient to contact office if experiencing any significant tolerability issues. Diagnoses and all orders for this visit:  PTSD (post-traumatic stress disorder)  Adjustment disorder with mixed anxiety and depressed mood  ADHD (attention deficit hyperactivity disorder), combined type  Major depressive disorder with single episode, in partial remission (HCC)  Generalized anxiety disorder -     diazepam (VALIUM) 5 MG tablet; Take 1 tablet (5 mg total) by mouth 2 (two) times daily.  Attention deficit hyperactivity disorder (ADHD), unspecified ADHD type -     amphetamine-dextroamphetamine (ADDERALL XR) 20 MG 24 hr capsule; Take 1 capsule (20 mg total) by mouth 2 (two) times daily. -     amphetamine-dextroamphetamine (ADDERALL XR) 20 MG 24 hr capsule; Take 1 capsule (20 mg total) by mouth 2 (two) times daily.     Please see After Visit Summary for patient specific instructions.  Future Appointments  Date Time Provider [place]  11/19/2021  2:00 PM [name], [facility] None    No orders of the defined types were placed in this encounter.     -------------------------------

## 2021-10-23 ENCOUNTER — Ambulatory Visit (INDEPENDENT_AMBULATORY_CARE_PROVIDER_SITE_OTHER): Payer: Self-pay | Admitting: Psychiatry

## 2021-10-23 DIAGNOSIS — F4310 Post-traumatic stress disorder, unspecified: Secondary | ICD-10-CM

## 2021-10-23 NOTE — Progress Notes (Signed)
[facility] Counselor/Therapist Progress Note  Patient ID: [name], MRN: [id number],    Date: 10/23/2021  Time Spent: 58 minutes start time 1:03 PM end time 2:01 PM  Virtual Visit via Video Note Connected with patient by a telemedicine/telehealth application, with their informed consent, and verified patient privacy and that I am speaking with the correct person using two identifiers. I discussed the limitations, risks, security and privacy concerns of performing psychotherapy and the availability of in person appointments. I also discussed with the patient that there may be a patient responsible charge related to this service. The patient expressed understanding and agreed to proceed. I discussed the treatment planning with the patient. The patient was provided an opportunity to ask questions and all were answered. The patient agreed with the plan and demonstrated an understanding of the instructions. The patient was advised to call  our office if  symptoms worsen or feel they are in a crisis state and need immediate contact.   Therapist Location: office Patient Location: home    Treatment Type: Individual Therapy  Reported Symptoms: anxiety, triggered responses, sadness, panic  Mental Status Exam:  Appearance:   Casual and Neat     Behavior:  Appropriate  Motor:  Normal  Speech/Language:   Normal Rate  Affect:  Appropriate  Mood:  anxious  Thought process:  normal  Thought content:    WNL  Sensory/Perceptual disturbances:    WNL  Orientation:  oriented to person, place, time/date, and situation  Attention:  Good  Concentration:  Good  Memory:  WNL  Fund of knowledge:   Good  Insight:    Good  Judgment:   Good  Impulse Control:  Good   Risk Assessment: Danger to Self:  No Self-injurious Behavior: No Danger to Others: No Duty to Warn:no Physical Aggression / Violence:No  Access to Firearms a concern: No  Gang Involvement:No   Subjective: Met with patient via  virtual session. She shared that moving is coming together and she is getting more excited. Patient shared that she and her son were doing better but than she tried to make  contact with her mother again. She went onto share that her mother sent her a very triggering message that was upsetting due to her being her mother and made patient feel that she was not enough.  Patient did processing set on her mother blaming her for her feelings, suds level 10, negative cognition "I do not matter" felt sadness anxiety and frustration in her heart throat and jaw.  Patient was able to reduce suds level to 5.  She was able to recognize there is a long history of her mother not taking accountability and blaming her for things.  Patient was able to recognize how that falls into her work situation as well as other relationships.  Discussed the current work situation and encouraged her to think about ways to communicate with her boss without being defensive but instead being assertive.  Patient was able to develop some plans that she felt would be helpful.  Interventions: Assertiveness/Communication, Eye Movement Desensitization and Reprocessing (EMDR), and Insight-Oriented  Diagnosis:   ICD-10-CM   1. PTSD (post-traumatic stress disorder)  F43.10       Plan:  Patient is to use CBT and coping skills to decrease triggered responses.  Patient is to exercise to release negative emotions appropriately.  Patient is to maintain distance from her family .  Patient is to follow plans to work on assertive communication  with her boss.  Patient is to work on affirming herself regularly that she is enough.  Patient is to watch Dr. Chrys Racer leaf's podcast on the neuro cycle.  Patient is to take medication as directed. Long-term goal: Recall the traumatic event without becoming overwhelmed with negative emotions Short-term goal: Practice implement relaxation training as a coping mechanism for tension panic stress anger and  anxiety  [name], [facility]

## 2021-10-28 ENCOUNTER — Ambulatory Visit (INDEPENDENT_AMBULATORY_CARE_PROVIDER_SITE_OTHER): Payer: Self-pay | Admitting: Psychiatry

## 2021-10-28 DIAGNOSIS — F4310 Post-traumatic stress disorder, unspecified: Secondary | ICD-10-CM

## 2021-10-28 NOTE — Progress Notes (Signed)
[facility] Counselor/Therapist Progress Note  Patient ID: [name], MRN: [id number],    Date: 10/28/2021  Time Spent: 57 minutes start time 1:01 PM end time 1:58 PM Virtual Visit via Video Note Connected with patient by a telemedicine/telehealth application, with their informed consent, and verified patient privacy and that I am speaking with the correct person using two identifiers. I discussed the limitations, risks, security and privacy concerns of performing psychotherapy and the availability of in person appointments. I also discussed with the patient that there may be a patient responsible charge related to this service. The patient expressed understanding and agreed to proceed. I discussed the treatment planning with the patient. The patient was provided an opportunity to ask questions and all were answered. The patient agreed with the plan and demonstrated an understanding of the instructions. The patient was advised to call  our office if  symptoms worsen or feel they are in a crisis state and need immediate contact.   Therapist Location: office Patient Location: home    Treatment Type: Individual Therapy  Reported Symptoms: triggered responses, anxiety, focusing issues, sadness  Mental Status Exam:  Appearance:   Casual and Neat     Behavior:  Appropriate  Motor:  Normal  Speech/Language:   Normal Rate  Affect:  Appropriate  Mood:  anxious  Thought process:  normal  Thought content:    WNL  Sensory/Perceptual disturbances:    WNL  Orientation:  oriented to person, place, time/date, and situation  Attention:  Good  Concentration:  Good  Memory:  WNL  Fund of knowledge:   Good  Insight:    Good  Judgment:   Good  Impulse Control:  Good   Risk Assessment: Danger to Self:  No Self-injurious Behavior: No Danger to Others: No Duty to Warn:no Physical Aggression / Violence:No  Access to Firearms a concern: No  Gang Involvement:No   Subjective: Met with  patient via virtual session. She shared she is doing much better with her mother after last session and she feels ready to let go of the relationship without being depressed. She shared she is using the visuals from session and they have helped.  She shared she followed the plan from last session and that helped her with her meeting with her boss.  There continue to be hard dynamics on her team but she was able to talk to her boss about the issues. She is still struggling with issues with 2 of her coworkers.  Patient was encouraged to think through CBT filters that she can use to help her talk herself through the different situations.  Also discussed ways that she could make sure she and her boss are on the same page so that there does not need to be any triangulation.  Patient was encouraged to remind herself that she is able to do her job and to stay focused on those things.  She was also encouraged to work on asking them lots of questions rather than getting defensive.  Different strategies to help her do that were addressed in session and plans were developed.  Interventions: Cognitive Behavioral Therapy, Solution-Oriented/Positive Psychology, and Insight-Oriented  Diagnosis:   ICD-10-CM   1. PTSD (post-traumatic stress disorder)  F43.10       Plan: Patient is to use CBT and coping skills to decrease triggered responses.  Patient is to exercise to release negative emotions appropriately.  Patient is to maintain distance from her family .  Patient is to follow  plans to work on assertive communication with her coworkers.   Patient is to work on affirming herself regularly that she is enough.  Patient is to watch Dr. Chrys Racer leaf's podcast on the neuro cycle.  Patient is to take medication as directed. Long-term goal: Recall the traumatic event without becoming overwhelmed with negative emotions Short-term goal: Practice implement relaxation training as a coping mechanism for tension panic stress anger  and anxiety    [name], [facility]

## 2021-11-04 ENCOUNTER — Ambulatory Visit (INDEPENDENT_AMBULATORY_CARE_PROVIDER_SITE_OTHER): Payer: Self-pay | Admitting: Psychiatry

## 2021-11-04 DIAGNOSIS — F4310 Post-traumatic stress disorder, unspecified: Secondary | ICD-10-CM

## 2021-11-04 NOTE — Progress Notes (Signed)
[facility] Counselor/Therapist Progress Note  Patient ID: [name], MRN: [id number],    Date: 11/04/2021  Time Spent: 46 minutes start time 4:04 PM end time 4:50 PM Virtual Visit via Video Note Connected with patient by a telemedicine/telehealth application, with their informed consent, and verified patient privacy and that I am speaking with the correct person using two identifiers. I discussed the limitations, risks, security and privacy concerns of performing psychotherapy and the availability of in person appointments. I also discussed with the patient that there may be a patient responsible charge related to this service. The patient expressed understanding and agreed to proceed. I discussed the treatment planning with the patient. The patient was provided an opportunity to ask questions and all were answered. The patient agreed with the plan and demonstrated an understanding of the instructions. The patient was advised to call  our office if  symptoms worsen or feel they are in a crisis state and need immediate contact.   Therapist Location: office Patient Location: home    Treatment Type: Individual Therapy  Reported Symptoms: anxiety, panic, triggered responses, overwhelmed, flashbacks  Mental Status Exam:  Appearance:   Casual     Behavior:  Appropriate  Motor:  Normal  Speech/Language:   Normal Rate  Affect:  Appropriate  Mood:  anxious  Thought process:  normal  Thought content:    WNL  Sensory/Perceptual disturbances:    WNL  Orientation:  oriented to person, place, time/date, and situation  Attention:  Good  Concentration:  Good  Memory:  WNL  Fund of knowledge:   Good  Insight:    Good  Judgment:   Good  Impulse Control:  Good   Risk Assessment: Danger to Self:  No Self-injurious Behavior: No Danger to Others: No Duty to Warn:no Physical Aggression / Violence:No  Access to Firearms a concern: No  Gang Involvement:No   Subjective: Met with patient  via virtual session.  She shared that she was still waiting on the movers to come and move her stuff and it has been very stressful for her. She still has more to do at her home while she is preparing to move. She is hopeful that they will be there soon.  She went on to share that her boss was laid off and her new boss is someone she works well with her.  So she is feeling better at work. She is not having any contact with her family and is feeling good about her decision. She went on to share that she has been having dreams and memories regarding her mother rejecting her did processing set on that issue suds level 10, negative cognition "my mom does not want me" felt sadness and hurt in her heart/chest.  Suds level was reduced to 3.  She reported she felt better and was ready to move on.  Discussed ways to continue the processing as she moves to her next destination.  Patient was encouraged to affirm herself regularly and work on releasing negative emotions through exercise.  Interventions: Solution-Oriented/Positive Psychology, Eye Movement Desensitization and Reprocessing (EMDR), and Insight-Oriented  Diagnosis:   ICD-10-CM   1. PTSD (post-traumatic stress disorder)  F43.10       Plan:  Patient is to use CBT and coping skills to decrease triggered responses.  Patient is to exercise to release negative emotions appropriately.  Patient is to maintain distance from her family .  Patient is to follow plans to work on assertive communication with her  coworkers.   Patient is to work on affirming herself regularly that she is enough.  Patient is to watch Dr. Caroline leaf's podcast on the neuro cycle.  Patient is to take medication as directed. Long-term goal: Recall the traumatic event without becoming overwhelmed with negative emotions Short-term goal: Practice implement relaxation training as a coping mechanism for tension panic stress anger and anxiety  Holly Ingram,  [facility]                   

## 2021-11-19 ENCOUNTER — Ambulatory Visit (INDEPENDENT_AMBULATORY_CARE_PROVIDER_SITE_OTHER): Payer: Self-pay | Admitting: Psychiatry

## 2021-11-19 DIAGNOSIS — F4310 Post-traumatic stress disorder, unspecified: Secondary | ICD-10-CM

## 2021-11-19 NOTE — Progress Notes (Addendum)
[facility] Counselor/Therapist Progress Note  Patient ID: [name], MRN: [id number],    Date: 11/19/2021  Time Spent: 50 minutes start time 2:04 PM end time 2:54 PM Virtual Visit via Video Note Connected with patient by a telemedicine/telehealth application, with their informed consent, and verified patient privacy and that I am speaking with the correct person using two identifiers. I discussed the limitations, risks, security and privacy concerns of performing psychotherapy and the availability of in person appointments. I also discussed with the patient that there may be a patient responsible charge related to this service. The patient expressed understanding and agreed to proceed. I discussed the treatment planning with the patient. The patient was provided an opportunity to ask questions and all were answered. The patient agreed with the plan and demonstrated an understanding of the instructions. The patient was advised to call  our office if  symptoms worsen or feel they are in a crisis state and need immediate contact.   Therapist Location: office Patient Location: [employer]    Treatment Type: Individual Therapy  Reported Symptoms: anxiety, triggered responses  Mental Status Exam:  Appearance:   Casual and Neat     Behavior:  Appropriate  Motor:  Normal  Speech/Language:   Normal Rate  Affect:  Appropriate  Mood:  anxious  Thought process:  normal  Thought content:    WNL  Sensory/Perceptual disturbances:    WNL  Orientation:  oriented to person, place, time/date, and situation  Attention:  Good  Concentration:  Good  Memory:  WNL  Fund of knowledge:   Good  Insight:    Good  Judgment:   Good  Impulse Control:  Good   Risk Assessment: Danger to Self:  No Self-injurious Behavior: No Danger to Others: No Duty to Warn:no Physical Aggression / Violence:No  Access to Firearms a concern: No  Gang Involvement:No   Subjective: Met with patient via virtual session.  She shared she is still in the progress of moving. She shared that her son was struggling with missing his grandmother so patient decided to let him call her but she didn't answer the phone.  She went on to share that her sister tried to contact her and that was triggering for her. Her mother also called her and left a message that upset as well. Discussed importance of her focusing on the fact that she has accomplished so much and she can make it on her own and will. 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  Patient was encouraged to continue working on setting limits taking care of herself and maintaining perspective as she goes to her next date.  Patient was informed that since she is moving to [place] clinician will not be able to work with her.  Patient was told that if she had a crisis and did not have another clinician yet she could contact clinician because her safety is most important.  Interventions: Solution-Oriented/Positive Psychology and Insight-Oriented  Diagnosis:   ICD-10-CM   1. PTSD (post-traumatic stress disorder)  F43.10       Plan: Patient is to use CBT and coping skills to decrease triggered responses.  Patient is to exercise to release negative emotions appropriately.  Patient is to maintain distance from her family .  Patient is to end sessions  with clinician due to moving to [place].  She was informed that if a crisis occurs that she does not have a clinician she can contact clinician.  Patient is to work on affirming herself regularly that she is enough.  Patient is to watch Dr. Chrys Racer leaf's podcast on the neuro cycle.  Patient is to take medication as directed. Long-term goal: Recall the traumatic event without becoming overwhelmed with negative emotions Short-term goal: Practice implement relaxation training  as a coping mechanism for tension panic stress anger and anxiety  [name], [facility]

## 2021-12-30 ENCOUNTER — Other Ambulatory Visit: Payer: Self-pay | Admitting: Adult Health

## 2021-12-30 DIAGNOSIS — F909 Attention-deficit hyperactivity disorder, unspecified type: Secondary | ICD-10-CM

## 2021-12-30 MED ORDER — AMPHETAMINE-DEXTROAMPHET ER 20 MG PO CP24: 20.0000 mg | ORAL_CAPSULE | Freq: Two times a day (BID) | ORAL | 0 refills | Status: AC

## 2021-12-30 NOTE — Telephone Encounter (Signed)
Last visit is 10/15/21. [name] is in [place], [place] and took her written RX for Adderall to [facility] and they do not accept written scripts from someone not living in [place]. They will need the script electronically sent to [facility], [address], [place], [place] [address]. Phone number [phone]

## 2022-01-13 ENCOUNTER — Telehealth: Payer: Self-pay | Admitting: Adult Health

## 2022-01-13 NOTE — Telephone Encounter (Signed)
Three months of medication was provided to the patient in June. [name] said she will not prescribe any more medication for the patient, needs to find a new provider. LVM to [facility].

## 2022-01-13 NOTE — Telephone Encounter (Signed)
Pt called reporting she just got moved in on the 9/18 now in  [place]. Requesting Rx for Diazepam 5 mg 2/d  and generic adderall XR 20 mg 2/d to [facility] [place] [address]. Will look for new provider.  Adderall  Rx due Oct 12. Contact # [phone] Last apt 6/28

## 2022-01-13 NOTE — Telephone Encounter (Signed)
Patient returned call and she was told that she had been supplied Rx for 3 months. She said she filled the diazepam in ? [place] and they kept the Rx that had refills on it. She said they were moving and have just not settled into their house and that it seems like we would understand that. Patient wanted to make another appt. I knew she couldn't make an appt via MyChart and when asked was told she could not make a phone appt due to her location.

## 2022-01-14 NOTE — Telephone Encounter (Signed)
Patient called back asking how she could get the balance of the refills on diazepam. She said she got filled in [facility]. I checked the PMP database this a.m. and she got diazepam filled at [facility] [phone]). PMP database shows she has 2 refills available. I asked the pharmacy since patient presented a paper script was there a way that she could get her refills. Was told that since patient had filled at least once that she could have new pharmacy call them and they could transfer the RF. Called and LVM for patient to [facility] at 10:20 a.m. today.

## 2022-03-17 ENCOUNTER — Ambulatory Visit (INDEPENDENT_AMBULATORY_CARE_PROVIDER_SITE_OTHER): Payer: Self-pay | Admitting: Psychiatry

## 2022-03-17 DIAGNOSIS — F4310 Post-traumatic stress disorder, unspecified: Secondary | ICD-10-CM

## 2022-03-17 NOTE — Progress Notes (Signed)
[facility] Counselor/Therapist Progress Note  Patient ID: [name], MRN: [id number],    Date: 03/17/2022  Time Spent: 50 minutes start time 11:03 AM end time 11:53 AM Virtual Visit via Video Note Connected with patient by a telemedicine/telehealth application, with their informed consent, and verified patient privacy and that I am speaking with the correct person using two identifiers. I discussed the limitations, risks, security and privacy concerns of performing psychotherapy and the availability of in person appointments. I also discussed with the patient that there may be a patient responsible charge related to this service. The patient expressed understanding and agreed to proceed. I discussed the treatment planning with the patient. The patient was provided an opportunity to ask questions and all were answered. The patient agreed with the plan and demonstrated an understanding of the instructions. The patient was advised to call  our office if  symptoms worsen or feel they are in a crisis state and need immediate contact.   Therapist Location: office Patient Location: home    Treatment Type: Individual Therapy  Reported Symptoms: sadness, anxiety, crying spells, triggered responses, rumination, crying spells  Mental Status Exam:  Appearance:   Well Groomed     Behavior:  Appropriate  Motor:  Normal  Speech/Language:   Normal Rate  Affect:  Appropriate  Mood:  anxious and sad  Thought process:  normal  Thought content:    WNL  Sensory/Perceptual disturbances:    WNL  Orientation:  oriented to person, place, time/date, and situation  Attention:  Good  Concentration:  Good  Memory:  WNL  Fund of knowledge:   Good  Insight:    Good  Judgment:   Good  Impulse Control:  Good   Risk Assessment: Danger to Self:  No Self-injurious Behavior: No Danger to Others: No Duty to Warn:no Physical Aggression / Violence:No  Access to Firearms a concern: No  Gang  Involvement:No   Subjective: Met with patient via virtual session.  She shared that she had made it to [place] and it was stressful but hard but she felt good about things. She went on to share things were going well until some things happened with her son that was very upsetting to her.  She went on to share that it was all very stressful. Patient shared what happened and how it impacted her.  Patient was allowed time to discuss the situation that was so upsetting for her.  As she discussed the issue she was able to recognize things that were triggered and need to be processed through.  Patient reported she wanted to complete that work before she went back to [place].  She was able to recognize she still does not want to move back completely to [facility] but she wants things to be better than they have been.  Agreed to have another session to process through situation using EMDR.  Patient reported she is not having contact with her mother and that has helped her relationship with her sister.  She is also working harder on her own self-care.  Interventions: Solution-Oriented/Positive Psychology and Insight-Oriented  Diagnosis:   ICD-10-CM   1. PTSD (post-traumatic stress disorder)  F43.10       Plan:  Patient is to use CBT and coping skills to decrease triggered responses.  Patient is to exercise to release negative emotions appropriately.  Patient is to maintain distance from her family .  Patient is to work on affirming herself regularly that she  is enough.  Patient is to watch Dr. Chrys Racer leaf's podcast on the neuro cycle.  Patient is to take medication as directed. Long-term goal: Recall the traumatic event without becoming overwhelmed with negative emotions Short-term goal: Practice implement relaxation training as a coping mechanism for tension panic stress anger and anxiety  [name], [facility]

## 2022-03-18 ENCOUNTER — Ambulatory Visit (INDEPENDENT_AMBULATORY_CARE_PROVIDER_SITE_OTHER): Payer: Self-pay | Admitting: Psychiatry

## 2022-03-18 DIAGNOSIS — F4310 Post-traumatic stress disorder, unspecified: Secondary | ICD-10-CM

## 2022-03-18 NOTE — Progress Notes (Signed)
[facility] Counselor/Therapist Progress Note  Patient ID: Michalle Rademaker, MRN: [id number],    Date: 03/18/2022  Time Spent: 52 minutes start time 3:01 PM end time 3:53 PM Virtual Visit via Video Note Connected with patient by a telemedicine/telehealth application, with their informed consent, and verified patient privacy and that I am speaking with the correct person using two identifiers. I discussed the limitations, risks, security and privacy concerns of performing psychotherapy and the availability of in person appointments. I also discussed with the patient that there may be a patient responsible charge related to this service. The patient expressed understanding and agreed to proceed. I discussed the treatment planning with the patient. The patient was provided an opportunity to ask questions and all were answered. The patient agreed with the plan and demonstrated an understanding of the instructions. The patient was advised to call  our office if  symptoms worsen or feel they are in a crisis state and need immediate contact.   Therapist Location: office Patient Location: home    Treatment Type: Individual Therapy  Reported Symptoms: anxiety, sadness, triggered responses, hypervigilance, rumination  Mental Status Exam:  Appearance:   Well Groomed     Behavior:  Appropriate  Motor:  Normal  Speech/Language:   Normal Rate  Affect:  Appropriate  Mood:  anxious  Thought process:  normal  Thought content:    WNL  Sensory/Perceptual disturbances:    WNL  Orientation:  oriented to person, place, time/date, and situation  Attention:  Good  Concentration:  Good  Memory:  WNL  Fund of knowledge:   Good  Insight:    Good  Judgment:   Good  Impulse Control:  Good   Risk Assessment: Danger to Self:  No Self-injurious Behavior: No Danger to Others: No Duty to Warn:no Physical Aggression / Violence:No  Access to Firearms a concern: No  Gang Involvement:No   Subjective: Met  with patient via virtual session.  She shared that she does feel that she is moving in a good direction even though she is overwhelmed. She shared she is trying to leave her parents in the past.  She shared that the school situation was still very difficult.  Did processing set on the principal, suds level 10, negative cognition "I am a target" felt sadness in her chest and arms.  Patient was able to reduce suds level to 4.  She was able to recognize that she did not know all the details about the principal or the teacher and that there were probably reasons for their responses much as they were reasons for hers.  Being able to pull back and have perspective was discussed with patient.  She was encouraged to look up CBT technique ST OP P to help her pull back and get perspective when she gets triggered in other situations.  Patient was able to recognize that she has to use her filters and get herself grounded before she responds.  Interventions: Cognitive Behavioral Therapy, 12-Step, Eye Movement Desensitization and Reprocessing (EMDR), and Insight-Oriented  Diagnosis:   ICD-10-CM   1. PTSD (post-traumatic stress disorder)  F43.10       Plan: Patient is to use CBT and coping skills to decrease triggered responses.  Patient is to exercise to release negative emotions appropriately.  Patient is to look up CBT technique ST OP P to utilize to help maintain perspective.  Patient will contact clinician when she is back in the state.  Patient is to maintain distance from  her family .  Patient is to work on affirming herself regularly that she is enough.  Patient is to watch Dr. Chrys Racer leaf's podcast on the neuro cycle.  Patient is to take medication as directed. Long-term goal: Recall the traumatic event without becoming overwhelmed with negative emotions Short-term goal: Practice implement relaxation training as a coping mechanism for tension panic stress anger and anxiety  [name],  [facility]

## 2022-04-09 ENCOUNTER — Ambulatory Visit (INDEPENDENT_AMBULATORY_CARE_PROVIDER_SITE_OTHER): Payer: Self-pay | Admitting: Psychiatry

## 2022-04-09 DIAGNOSIS — F4310 Post-traumatic stress disorder, unspecified: Secondary | ICD-10-CM

## 2022-04-09 NOTE — Progress Notes (Unsigned)
[facility] Counselor/Therapist Progress Note  Patient ID: [name], MRN: [id number],    Date: 04/09/2022  Time Spent: 47 Minutes start time 3:04 PM end time 3:54 PM Virtual Visit via Video Note Connected with patient by a telemedicine/telehealth application, with their informed consent, and verified patient privacy and that I am speaking with the correct person using two identifiers. I discussed the limitations, risks, security and privacy concerns of performing psychotherapy and the availability of in person appointments. I also discussed with the patient that there may be a patient responsible charge related to this service. The patient expressed understanding and agreed to proceed. I discussed the treatment planning with the patient. The patient was provided an opportunity to ask questions and all were answered. The patient agreed with the plan and demonstrated an understanding of the instructions. The patient was advised to call  our office if  symptoms worsen or feel they are in a crisis state and need immediate contact.   Therapist Location: office Patient Location: home     Treatment Type: Individual Therapy  Reported Symptoms: anxiety, triggered responses, hypervigilance, sadness,  Mental Status Exam:  Appearance:   Casual     Behavior:  Appropriate  Motor:  Normal  Speech/Language:   Normal Rate  Affect:  Appropriate  Mood:  anxious  Thought process:  normal  Thought content:    WNL  Sensory/Perceptual disturbances:    WNL  Orientation:  oriented to person, place, time/date, and situation  Attention:  Good  Concentration:  Good  Memory:  WNL  Fund of knowledge:   Good  Insight:    Good  Judgment:   Good  Impulse Control:  Good   Risk Assessment: Danger to Self:  No Self-injurious Behavior: No Danger to Others: No Duty to Warn:no Physical Aggression / Violence:No  Access to Firearms a concern: No  Gang Involvement:No   Subjective: Met with patient via  virtual session.  She was able to sell her home so she will be able to completely move to [place].  She shared she is working on taking things slow at the new school.  She went on to share she is still struggling with the whole situation.  She reported the worst part was the teacher refusing to talk with her, suds level 10, negative cognition "I am different" felt sadness and frustration in her throat and chest.  Patient was able to reduce suds level to 5.  She was able to recognize that being different was an okay thing and she needed to embrace it rather than fighting things.  Different strategies to help her when she goes back out to [place] and to maintain perspective and recognize that even though she is different in many ways she is still enough were discussed in session.  Patient was encouraged to remind herself of the truth as well as her son on a regular basis.  Interventions: Eye Movement Desensitization and Reprocessing (EMDR) and Insight-Oriented  Diagnosis:   ICD-10-CM   1. PTSD (post-traumatic stress disorder)  F43.10       Plan: Patient is to use CBT and coping skills to decrease triggered responses.  Patient is to exercise to release negative emotions appropriately.  Patient is to look up CBT technique ST OP P to utilize to help maintain perspective.  Patient will contact clinician when she is back in the state.  Patient is to maintain distance from her family .  Patient is to work on affirming herself regularly  that she is enough.  Patient is to watch Dr. Chrys Racer leaf's podcast on the neuro cycle.  Patient is to take medication as directed. Long-term goal: Recall the traumatic event without becoming overwhelmed with negative emotions Short-term goal: Practice implement relaxation training as a coping mechanism for tension panic stress anger and anxiety    [name], [facility]
